# Patient Record
Sex: Female | Born: 1935 | Race: Black or African American | Hispanic: No | Marital: Single | Smoking: Never smoker
Health system: Southern US, Community
[De-identification: ages and names within clinical notes are randomized; demographics above are authoritative.]

## PROBLEM LIST (undated history)

## (undated) DIAGNOSIS — I10 Essential (primary) hypertension: Secondary | ICD-10-CM

## (undated) DIAGNOSIS — R799 Abnormal finding of blood chemistry, unspecified: Secondary | ICD-10-CM

## (undated) DIAGNOSIS — I350 Nonrheumatic aortic (valve) stenosis: Secondary | ICD-10-CM

## (undated) DIAGNOSIS — I5032 Chronic diastolic (congestive) heart failure: Secondary | ICD-10-CM

## (undated) DIAGNOSIS — E7800 Pure hypercholesterolemia, unspecified: Secondary | ICD-10-CM

## (undated) DIAGNOSIS — R748 Abnormal levels of other serum enzymes: Secondary | ICD-10-CM

## (undated) DIAGNOSIS — E119 Type 2 diabetes mellitus without complications: Secondary | ICD-10-CM

## (undated) HISTORY — PX: ABDOMINAL HYSTERECTOMY: SHX81

## (undated) HISTORY — DX: Chronic diastolic (congestive) heart failure: I50.32

## (undated) HISTORY — DX: Nonrheumatic aortic (valve) stenosis: I35.0

---

## 2012-12-03 ENCOUNTER — Emergency Department (HOSPITAL_BASED_OUTPATIENT_CLINIC_OR_DEPARTMENT_OTHER)
Admission: EM | Admit: 2012-12-03 | Discharge: 2012-12-03 | Disposition: A | Discharge status: Home / Self Care | Payer: Medicare Other | Attending: Emergency Medicine | Admitting: Emergency Medicine

## 2012-12-03 ENCOUNTER — Emergency Department (HOSPITAL_BASED_OUTPATIENT_CLINIC_OR_DEPARTMENT_OTHER): Payer: Medicare Other

## 2012-12-03 ENCOUNTER — Encounter (HOSPITAL_BASED_OUTPATIENT_CLINIC_OR_DEPARTMENT_OTHER): Payer: Self-pay | Admitting: Emergency Medicine

## 2012-12-03 DIAGNOSIS — I10 Essential (primary) hypertension: Secondary | ICD-10-CM | POA: Insufficient documentation

## 2012-12-03 DIAGNOSIS — E119 Type 2 diabetes mellitus without complications: Secondary | ICD-10-CM | POA: Insufficient documentation

## 2012-12-03 DIAGNOSIS — Z79899 Other long term (current) drug therapy: Secondary | ICD-10-CM | POA: Insufficient documentation

## 2012-12-03 DIAGNOSIS — S7000XA Contusion of unspecified hip, initial encounter: Secondary | ICD-10-CM | POA: Insufficient documentation

## 2012-12-03 DIAGNOSIS — S7001XA Contusion of right hip, initial encounter: Secondary | ICD-10-CM

## 2012-12-03 DIAGNOSIS — Y929 Unspecified place or not applicable: Secondary | ICD-10-CM | POA: Insufficient documentation

## 2012-12-03 DIAGNOSIS — Y939 Activity, unspecified: Secondary | ICD-10-CM | POA: Insufficient documentation

## 2012-12-03 DIAGNOSIS — Z880 Allergy status to penicillin: Secondary | ICD-10-CM | POA: Insufficient documentation

## 2012-12-03 DIAGNOSIS — S4980XA Other specified injuries of shoulder and upper arm, unspecified arm, initial encounter: Secondary | ICD-10-CM | POA: Insufficient documentation

## 2012-12-03 DIAGNOSIS — S46909A Unspecified injury of unspecified muscle, fascia and tendon at shoulder and upper arm level, unspecified arm, initial encounter: Secondary | ICD-10-CM | POA: Insufficient documentation

## 2012-12-03 DIAGNOSIS — W010XXA Fall on same level from slipping, tripping and stumbling without subsequent striking against object, initial encounter: Secondary | ICD-10-CM | POA: Insufficient documentation

## 2012-12-03 HISTORY — DX: Pure hypercholesterolemia, unspecified: E78.00

## 2012-12-03 HISTORY — DX: Type 2 diabetes mellitus without complications: E11.9

## 2012-12-03 HISTORY — DX: Essential (primary) hypertension: I10

## 2012-12-03 MED ORDER — HYDROCODONE-ACETAMINOPHEN 5-325 MG PO TABS: 2.0000 | ORAL_TABLET | ORAL | Status: DC | PRN

## 2012-12-03 NOTE — ED Provider Notes (Signed)
CSN: [id number]     Arrival date & time 12/03/12  1258 History   None    Chief Complaint  Patient presents with  . Fall   (Consider location/radiation/quality/duration/timing/severity/associated sxs/prior Treatment) Patient is a 79 y.o. female presenting with fall. The history is provided by the patient. No language interpreter was used.  Fall This is a new problem. The current episode started today. The problem occurs constantly. The problem has been unchanged. Nothing aggravates the symptoms. She has tried nothing for the symptoms. The treatment provided moderate relief.  Pt fell and hit on hip and shoulder  Past Medical History  Diagnosis Date  . Hypertension   . Diabetes mellitus without complication   . High cholesterol    History reviewed. No pertinent past surgical history. No family history on file. History  Substance Use Topics  . Smoking status: Never Smoker   . Smokeless tobacco: Not on file  . Alcohol Use: No   OB History   Grav Para Term Preterm Abortions TAB SAB Ect Mult Living                 Review of Systems  All other systems reviewed and are negative.    Allergies  Penicillins  Home Medications   Current Outpatient Rx  Name  Route  Sig  Dispense  Refill  . METFORMIN HCL PO   Oral   Take by mouth.         Marland Kitchen UNKNOWN TO PATIENT      BP med and cholesterol med-does not know name of meds          BP 154/58  Pulse 59  Temp(Src) 97.7 F (36.5 C) (Oral)  Resp 20  Ht 5\' 6"  (1.676 m)  Wt 168 lb (76.204 kg)  BMI 27.13 kg/m2  SpO2 99% Physical Exam  Nursing note and vitals reviewed. Constitutional: She is oriented to person, place, and time. She appears well-developed and well-nourished.  HENT:  Head: Normocephalic.  Eyes: EOM are normal. Pupils are equal, round, and reactive to light.  Neck: Normal range of motion.  Cardiovascular: Normal rate and normal heart sounds.   Pulmonary/Chest: Effort normal and breath sounds normal.   Abdominal: She exhibits no distension.  Musculoskeletal: She exhibits tenderness.  Tender right hip to palpation,  Tender right shoulder,   Pt able to ambulate without difficulty  Neurological: She is alert and oriented to person, place, and time.  Skin: Skin is warm.  Psychiatric: She has a normal mood and affect.    ED Course  Procedures (including critical care time) Labs Review Labs Reviewed - No data to display Imaging Review Dg Shoulder Right  12/03/2012   CLINICAL DATA:  Right shoulder pain after fall.  EXAM: RIGHT SHOULDER - 2+ VIEW  COMPARISON:  None.  FINDINGS: There is no evidence of fracture or dislocation. No significant degenerative joint disease is noted. Small calcification is seen over greater tuberosity concerning for calcific tendonitis. Soft tissues are unremarkable.  IMPRESSION: Possible calcific tendonitis seen. No fracture or dislocation is noted.   Electronically Signed   By: Sabino Dick M.D.   On: 12/03/2012 13:45   Dg Hip Complete Right  12/03/2012   CLINICAL DATA:  Right hip pain after fall.  EXAM: RIGHT HIP - COMPLETE 2+ VIEW  COMPARISON:  None.  FINDINGS: There is no evidence of hip fracture or dislocation. There is no evidence of arthropathy or other focal bone abnormality.  IMPRESSION: Negative.   Electronically Signed  By: Sabino Dick M.D.   On: 12/03/2012 13:46    EKG Interpretation   None       MDM   1. Contusion of right hip, initial encounter    Hydrocodone,   Pt advised to follow up with her Md for recheck    Fransico Meadow, PA-C 12/03/12 2147  [place], PA-C 12/03/12 2148

## 2012-12-03 NOTE — ED Notes (Addendum)
Tripped/fell Friday 10/17-c/o pain to right shoulder and right hip-states she has been walking

## 2012-12-04 NOTE — ED Provider Notes (Signed)
Medical screening examination/treatment/procedure(s) were performed by non-physician practitioner and as supervising physician I was immediately available for consultation/collaboration.  EKG Interpretation   None         Lebron Quam, MD 12/04/12 [phone]

## 2016-04-02 ENCOUNTER — Ambulatory Visit
Admission: RE | Admit: 2016-04-02 | Discharge: 2016-04-02 | Disposition: A | Discharge status: Home / Self Care | Payer: Medicare Other | Source: Ambulatory Visit | Attending: Internal Medicine | Admitting: Internal Medicine

## 2016-04-02 ENCOUNTER — Other Ambulatory Visit: Payer: Self-pay | Admitting: Internal Medicine

## 2016-04-02 DIAGNOSIS — R0602 Shortness of breath: Secondary | ICD-10-CM

## 2016-04-02 DIAGNOSIS — M542 Cervicalgia: Secondary | ICD-10-CM

## 2016-06-18 ENCOUNTER — Emergency Department (HOSPITAL_COMMUNITY): Payer: Medicare Other

## 2016-06-18 ENCOUNTER — Emergency Department (HOSPITAL_COMMUNITY)
Admission: EM | Admit: 2016-06-18 | Discharge: 2016-06-18 | Disposition: A | Discharge status: Home / Self Care | Payer: Medicare Other | Attending: Emergency Medicine | Admitting: Emergency Medicine

## 2016-06-18 DIAGNOSIS — R930 Abnormal findings on diagnostic imaging of skull and head, not elsewhere classified: Secondary | ICD-10-CM | POA: Insufficient documentation

## 2016-06-18 DIAGNOSIS — I10 Essential (primary) hypertension: Secondary | ICD-10-CM | POA: Diagnosis not present

## 2016-06-18 DIAGNOSIS — R42 Dizziness and giddiness: Secondary | ICD-10-CM | POA: Diagnosis not present

## 2016-06-18 DIAGNOSIS — R9389 Abnormal findings on diagnostic imaging of other specified body structures: Secondary | ICD-10-CM

## 2016-06-18 DIAGNOSIS — E119 Type 2 diabetes mellitus without complications: Secondary | ICD-10-CM | POA: Diagnosis not present

## 2016-06-18 DIAGNOSIS — Z79899 Other long term (current) drug therapy: Secondary | ICD-10-CM | POA: Diagnosis not present

## 2016-06-18 LAB — COMPREHENSIVE METABOLIC PANEL
ALK PHOS: 65 U/L (ref 38–126)
ALT: 9 U/L — ABNORMAL LOW (ref 14–54)
AST: 14 U/L — ABNORMAL LOW (ref 15–41)
Albumin: 3.7 g/dL (ref 3.5–5.0)
Anion gap: 9 (ref 5–15)
BILIRUBIN TOTAL: 0.6 mg/dL (ref 0.3–1.2)
BUN: 37 mg/dL — ABNORMAL HIGH (ref 6–20)
CO2: 23 mmol/L (ref 22–32)
CREATININE: 2.13 mg/dL — AB (ref 0.44–1.00)
Calcium: 8.9 mg/dL (ref 8.9–10.3)
Chloride: 106 mmol/L (ref 101–111)
GFR calc non Af Amer: 19 mL/min — ABNORMAL LOW (ref >60)
GFR, EST AFRICAN AMERICAN: 22 mL/min — AB (ref >60)
Glucose, Bld: 137 mg/dL — ABNORMAL HIGH (ref 65–99)
Potassium: 4.9 mmol/L (ref 3.5–5.1)
Sodium: 138 mmol/L (ref 135–145)
Total Protein: 6 g/dL — ABNORMAL LOW (ref 6.5–8.1)

## 2016-06-18 LAB — CBC
HEMATOCRIT: 32.8 % — AB (ref 36.0–46.0)
HEMOGLOBIN: 10.7 g/dL — AB (ref 12.0–15.0)
MCH: 29.6 pg (ref 26.0–34.0)
MCHC: 32.6 g/dL (ref 30.0–36.0)
MCV: 90.9 fL (ref 78.0–100.0)
Platelets: 143 10*3/uL — ABNORMAL LOW (ref 150–400)
RBC: 3.61 MIL/uL — ABNORMAL LOW (ref 3.87–5.11)
RDW: 14 % (ref 11.5–15.5)
WBC: 11 10*3/uL — ABNORMAL HIGH (ref 4.0–10.5)

## 2016-06-18 LAB — CBG MONITORING, ED: Glucose-Capillary: 137 mg/dL — ABNORMAL HIGH (ref 65–99)

## 2016-06-18 MED ORDER — MECLIZINE HCL 25 MG PO TABS: 12.5000 mg | ORAL_TABLET | Freq: Two times a day (BID) | ORAL | 0 refills | Status: DC | PRN

## 2016-06-18 MED ORDER — MECLIZINE HCL 25 MG PO TABS
25.0000 mg | ORAL_TABLET | Freq: Once | ORAL | Status: AC
  Administered 2016-06-18: 25 mg via ORAL
  Filled 2016-06-18: qty 1

## 2016-06-18 NOTE — ED Provider Notes (Signed)
[facility] DEPT Provider Note   CSN: [id number] Arrival date & time: 06/18/16  1433     History   Chief Complaint Chief Complaint  Patient presents with  . Dizziness    HPI Amber [name] is a 83 y.o. female.  The history is provided by the patient and a relative.  Dizziness  Quality:  Imbalance Severity:  Mild Onset quality:  Sudden Duration:  1 day Timing:  Intermittent Progression:  Unchanged Chronicity:  Recurrent Context comment:  Head movement Relieved by:  Closing eyes and being still Worsened by:  Eye movement and turning head Ineffective treatments:  None tried Associated symptoms: no chest pain, no diarrhea, no headaches, no shortness of breath, no syncope, no tinnitus, no vision changes and no vomiting     Past Medical History:  Diagnosis Date  . Diabetes mellitus without complication   . High cholesterol   . Hypertension     There are no active problems to display for this patient.   No past surgical history on file.  OB History    No data available       Home Medications    Prior to Admission medications   Medication Sig Start Date End Date Taking? Authorizing Provider  amLODipine-benazepril (LOTREL) 10-40 MG capsule Take 1 capsule by mouth daily.   Yes [provider]  furosemide (LASIX) 40 MG tablet Take 40 mg by mouth every Monday, Wednesday, and Friday.   Yes [provider]  rosuvastatin (CRESTOR) 20 MG tablet Take 20 mg by mouth daily.   Yes [provider]  HYDROcodone-acetaminophen (NORCO/VICODIN) 5-325 MG per tablet Take 2 tablets by mouth every 4 (four) hours as needed for pain. Patient not taking: Reported on 06/18/2016 12/03/12   Fransico Meadow, PA-C  meclizine (ANTIVERT) 25 MG tablet Take 0.5 tablets (12.5 mg total) by mouth 2 (two) times daily as needed for dizziness. 06/18/16   Heriberto Antigua, MD    Family History No family history on file.  Social History Social History  Substance Use Topics   . Smoking status: Never Smoker  . Smokeless tobacco: Not on file  . Alcohol use No     Allergies   Penicillins   Review of Systems Review of Systems  Constitutional: Negative for fever.  HENT: Negative for tinnitus.   Eyes: Negative.   Respiratory: Negative for shortness of breath.   Cardiovascular: Negative for chest pain and syncope.  Gastrointestinal: Negative for abdominal pain, constipation, diarrhea and vomiting.  Genitourinary: Negative.   Neurological: Positive for dizziness. Negative for syncope and headaches.  All other systems reviewed and are negative.    Physical Exam Updated Vital Signs BP (!) 174/55 (BP Location: Right Arm)   Pulse (!) 55   Temp 98.3 F (36.8 C) (Oral)   Resp 18   SpO2 94%   Physical Exam  Constitutional: She is oriented to person, place, and time. She appears well-developed and well-nourished. No distress.  HENT:  Head: Normocephalic and atraumatic.  Mouth/Throat: Oropharynx is clear and moist.  Eyes: Conjunctivae and EOM are normal. Pupils are equal, round, and reactive to light.  No vertical or rotational nystagmus  Neck: Normal range of motion. Neck supple.  Cardiovascular: Normal rate, regular rhythm and intact distal pulses.   No murmur heard. Pulmonary/Chest: Effort normal and breath sounds normal. No respiratory distress.  Abdominal: Soft. There is no tenderness.  Musculoskeletal: She exhibits no edema or tenderness.  Neurological: She is alert and oriented to person, place, and time.  No cranial nerve deficit or sensory deficit. She exhibits normal muscle tone. Coordination normal.  Skin: Skin is warm and dry.  Psychiatric: She has a normal mood and affect.  Nursing note and vitals reviewed.    ED Treatments / Results  Labs (all labs ordered are listed, but only abnormal results are displayed) Labs Reviewed  CBC - Abnormal; Notable for the following:       Result Value   WBC 11.0 (*)    RBC 3.61 (*)    Hemoglobin  10.7 (*)    HCT 32.8 (*)    Platelets 143 (*)    All other components within normal limits  COMPREHENSIVE METABOLIC PANEL - Abnormal; Notable for the following:    Glucose, Bld 137 (*)    BUN 37 (*)    Creatinine, Ser 2.13 (*)    Total Protein 6.0 (*)    AST 14 (*)    ALT 9 (*)    GFR calc non Af Amer 19 (*)    GFR calc Af Amer 22 (*)    All other components within normal limits  CBG MONITORING, ED - Abnormal; Notable for the following:    Glucose-Capillary 137 (*)    All other components within normal limits    EKG  EKG Interpretation  Date/Time:  Monday Jun 18 2016 14:49:54 EDT Ventricular Rate:  53 PR Interval:    QRS Duration: 132 QT Interval:  482 QTC Calculation: 453 R Axis:   -46 Text Interpretation:  Sinus rhythm Nonspecific IVCD with LAD Left ventricular hypertrophy Confirmed by Stark Jock  MD, DOUGLAS ([id number]) on 06/18/2016 3:18:48 PM       Radiology Mr Brain Wo Contrast  Result Date: 06/18/2016 CLINICAL DATA:  Dizziness and general weakness. Similar episodes in the past. EXAM: MRI HEAD WITHOUT CONTRAST TECHNIQUE: Multiplanar, multiecho pulse sequences of the brain and surrounding structures were obtained without intravenous contrast. COMPARISON:  Cervical spine plain films 04/02/2016 FINDINGS: Brain: No evidence for acute infarction, hemorrhage, mass lesion, hydrocephalus, or extra-axial fluid. Advanced atrophy. Chronic microvascular ischemic change. Remote RIGHT basal ganglia infarct. Vascular: Normal flow voids. Skull and upper cervical spine: Normal skullbase marrow signal. There is marked pannus surrounding the odontoid. There could be osseous erosion, possible bone marrow edema, at the base of the odontoid, body of C2, not well visualized on other pulse sequences. Sinuses/Orbits: Negative. Other: None. IMPRESSION: Advanced atrophy and chronic microvascular ischemic change. No acute intracranial abnormality. Marked pannus surrounds the odontoid, with possible erosion of the  body of C2, incompletely evaluated. Recommend MRI of the cervical spine without with contrast, with special attention to the upper cervical region. Electronically Signed   By: Staci Righter M.D.   On: 06/18/2016 20:46    Procedures Procedures (including critical care time)  Medications Ordered in ED Medications  meclizine (ANTIVERT) tablet 25 mg (25 mg Oral Given 06/18/16 1602)     Initial Impression / Assessment and Plan / ED Course  I have reviewed the triage vital signs and the nursing notes.  Pertinent labs & imaging results that were available during my care of the patient were reviewed by me and considered in my medical decision making (see chart for details).     Patient is a 83 year old female with above past medical history presents with 1 day of dizziness that she describes as spinning and off balance. No syncope, chest pain, shortness of breath, headache. No inciting events. She states this is worsened with any sort of head movement and better  when she is lying still. Vital signs unremarkable and exam as above without neuro deficits but symptoms are exacerbated with standing and head movement. Orthostatic blood pressures without change. Labs obtained without anemia or electrolyte disturbances. Given her age MRI obtained without evidence of posterior circulation findings. Patient given meclizine with complete resolution of symptoms. At this time I doubt ACS, CVA, or other acute surgical or infective etiology.  I have reviewed all labs, ekg, and imaging. Patient stable for discharge home.  I have reviewed all results with the patient. Advised to f/u with pcp within 3-5 days. Will rx meclizine. Patient agrees to stated plan. All questions answered. Advised to call or return to have any questions, new symptoms, change in symptoms, or symptoms that they do not understand.   Final Clinical Impressions(s) / ED Diagnoses   Final diagnoses:  Dizziness  Abnormal MRI    New  Prescriptions Discharge Medication List as of 06/18/2016  9:04 PM    START taking these medications   Details  meclizine (ANTIVERT) 25 MG tablet Take 0.5 tablets (12.5 mg total) by mouth 2 (two) times daily as needed for dizziness., Starting Mon 06/18/2016, Print         Heriberto Antigua, MD 06/19/16 Donzetta Kohut    Veryl Speak, MD 06/19/16 2201

## 2016-06-18 NOTE — Discharge Instructions (Signed)
call or return to have any questions, new symptoms, change in symptoms, or symptoms that you  do not understand.

## 2016-06-18 NOTE — ED Triage Notes (Signed)
CO of dizziness and general weakness with similar episodes in the past. Family states that family needs help to walk which is abnormal for her. No LOC or actual falls. Orthostatic changes with EMS.

## 2016-06-20 ENCOUNTER — Emergency Department (HOSPITAL_COMMUNITY)
Admission: EM | Admit: 2016-06-20 | Discharge: 2016-06-20 | Disposition: A | Discharge status: Home / Self Care | Payer: Medicare Other | Attending: Emergency Medicine | Admitting: Emergency Medicine

## 2016-06-20 ENCOUNTER — Encounter (HOSPITAL_COMMUNITY): Payer: Self-pay | Admitting: *Deleted

## 2016-06-20 DIAGNOSIS — Z79899 Other long term (current) drug therapy: Secondary | ICD-10-CM | POA: Diagnosis not present

## 2016-06-20 DIAGNOSIS — R42 Dizziness and giddiness: Secondary | ICD-10-CM | POA: Insufficient documentation

## 2016-06-20 DIAGNOSIS — I10 Essential (primary) hypertension: Secondary | ICD-10-CM | POA: Diagnosis not present

## 2016-06-20 DIAGNOSIS — E119 Type 2 diabetes mellitus without complications: Secondary | ICD-10-CM | POA: Insufficient documentation

## 2016-06-20 LAB — BASIC METABOLIC PANEL
Anion gap: 9 (ref 5–15)
BUN: 36 mg/dL — ABNORMAL HIGH (ref 6–20)
CALCIUM: 8.7 mg/dL — AB (ref 8.9–10.3)
CHLORIDE: 105 mmol/L (ref 101–111)
CO2: 23 mmol/L (ref 22–32)
CREATININE: 2.16 mg/dL — AB (ref 0.44–1.00)
GFR calc Af Amer: 21 mL/min — ABNORMAL LOW (ref >60)
GFR calc non Af Amer: 19 mL/min — ABNORMAL LOW (ref >60)
GLUCOSE: 135 mg/dL — AB (ref 65–99)
Potassium: 4.5 mmol/L (ref 3.5–5.1)
Sodium: 137 mmol/L (ref 135–145)

## 2016-06-20 LAB — CBC
HCT: 33.1 % — ABNORMAL LOW (ref 36.0–46.0)
Hemoglobin: 10.7 g/dL — ABNORMAL LOW (ref 12.0–15.0)
MCH: 29.1 pg (ref 26.0–34.0)
MCHC: 32.3 g/dL (ref 30.0–36.0)
MCV: 89.9 fL (ref 78.0–100.0)
PLATELETS: 165 10*3/uL (ref 150–400)
RBC: 3.68 MIL/uL — AB (ref 3.87–5.11)
RDW: 13.6 % (ref 11.5–15.5)
WBC: 7.5 10*3/uL (ref 4.0–10.5)

## 2016-06-20 MED ORDER — MECLIZINE HCL 25 MG PO TABS
12.5000 mg | ORAL_TABLET | Freq: Once | ORAL | Status: DC
  Filled 2016-06-20: qty 1

## 2016-06-20 MED ORDER — MECLIZINE HCL 25 MG PO TABS: 12.5000 mg | ORAL_TABLET | Freq: Three times a day (TID) | ORAL | 0 refills | Status: AC | PRN

## 2016-06-20 NOTE — ED Triage Notes (Signed)
Pt was seen here on Sunday, diagnosed with vertigo and given meclizine prescription. Pt has taken two half tablets today for dizziness, which she feels has helped some. Pt reports her family brought her back in to make sure she was ok

## 2016-06-20 NOTE — ED Notes (Signed)
Pt ambulated self efficiently to restroom with no difficulty.

## 2016-06-21 NOTE — ED Provider Notes (Signed)
[facility] DEPT Provider Note   CSN: [id number] Arrival date & time: 06/20/16  1854     History   Chief Complaint Chief Complaint  Patient presents with  . Dizziness    HPI Amber [name] is a 83 y.o. female.   Dizziness  Quality:  Vertigo Severity:  Moderate Onset quality:  Sudden Duration:  2 hours Timing:  Constant Progression:  Resolved Chronicity:  Recurrent Context: head movement   Relieved by: meclizine. Worsened by:  Movement Risk factors: hx of vertigo     Past Medical History:  Diagnosis Date  . Diabetes mellitus without complication ([place])   . High cholesterol   . Hypertension     There are no active problems to display for this patient.   History reviewed. No pertinent surgical history.  OB History    No data available       Home Medications    Prior to Admission medications   Medication Sig Start Date End Date Taking? Authorizing Provider  amLODipine-benazepril (LOTREL) 10-40 MG capsule Take 1 capsule by mouth daily.    [provider]  furosemide (LASIX) 40 MG tablet Take 40 mg by mouth every Monday, Wednesday, and Friday.    [provider]  HYDROcodone-acetaminophen (NORCO/VICODIN) 5-325 MG per tablet Take 2 tablets by mouth every 4 (four) hours as needed for pain. Patient not taking: Reported on 06/18/2016 12/03/12   Fransico Meadow, PA-C  meclizine (ANTIVERT) 25 MG tablet Take 0.5-1 tablets (12.5-25 mg total) by mouth 3 (three) times daily as needed for dizziness. 06/20/16   Jondavid Schreier, Corene Cornea, MD  rosuvastatin (CRESTOR) 20 MG tablet Take 20 mg by mouth daily.    [provider]    Family History No family history on file.  Social History Social History  Substance Use Topics  . Smoking status: Never Smoker  . Smokeless tobacco: Never Used  . Alcohol use No     Allergies   Penicillins   Review of Systems Review of Systems  Neurological: Positive for dizziness.  All other systems reviewed and are  negative.    Physical Exam Updated Vital Signs BP (!) 176/50   Pulse (!) 51   Temp 98.2 F (36.8 C)   Resp 15   SpO2 95%   Physical Exam  Constitutional: She is oriented to person, place, and time. She appears well-developed and well-nourished.  HENT:  Head: Normocephalic and atraumatic.  Eyes: Conjunctivae and EOM are normal.  No nystagmus  Neck: Normal range of motion.  Cardiovascular: Normal rate and regular rhythm.   Pulmonary/Chest: No stridor. No respiratory distress.  Abdominal: She exhibits no distension.  Neurological: She is alert and oriented to person, place, and time. No cranial nerve deficit. Coordination normal.  No altered mental status, able to give full seemingly accurate history.  Face is symmetric, EOM's intact, pupils equal and reactive, vision intact, tongue and uvula midline without deviation Upper and Lower extremity motor 5/5, intact pain perception in distal extremities, 2+ reflexes in biceps, patella and achilles tendons. Finger to nose normal, heel to shin normal. Walks without assistance or evident ataxia.    Nursing note and vitals reviewed.    ED Treatments / Results  Labs (all labs ordered are listed, but only abnormal results are displayed) Labs Reviewed  BASIC METABOLIC PANEL - Abnormal; Notable for the following:       Result Value   Glucose, Bld 135 (*)    BUN 36 (*)    Creatinine, Ser 2.16 (*)  Calcium 8.7 (*)    GFR calc non Af Amer 19 (*)    GFR calc Af Amer 21 (*)    All other components within normal limits  CBC - Abnormal; Notable for the following:    RBC 3.68 (*)    Hemoglobin 10.7 (*)    HCT 33.1 (*)    All other components within normal limits    EKG  EKG Interpretation  Date/Time:  Wednesday Jun 20 2016 19:13:39 EDT Ventricular Rate:  49 PR Interval:  196 QRS Duration: 130 QT Interval:  468 QTC Calculation: 422 R Axis:   -47 Text Interpretation:  Sinus bradycardia Left axis deviation Left bundle branch  block Abnormal ECG rhtyhm and rate same as may 7 Confirmed by [facility] MD, Corene Cornea [phone]) on 06/20/2016 9:49:51 PM       Radiology No results found.  Procedures Procedures (including critical care time)  Medications Ordered in ED Medications - No data to display   Initial Impression / Assessment and Plan / ED Course  I have reviewed the triage vital signs and the nursing notes.  Pertinent labs & imaging results that were available during my care of the patient were reviewed by me and considered in my medical decision making (see chart for details).     Recurrent episodes of vertigo today. Patient symptoms improved with medications but family did not understand medication/follow up instructions from previous visit where full workup was done.  HR was documented as 39 from triage however I did not see it lower than 49 and she was asymptomatic so I doubt it is related.  Will fu w/ pcp on Friday as scheduled, otherwise will follow up with ENT in a week if not improving.   Final Clinical Impressions(s) / ED Diagnoses   Final diagnoses:  Vertigo    New Prescriptions Discharge Medication List as of 06/20/2016 10:58 PM       Mariano Doshi, Corene Cornea, MD 06/21/16 1200

## 2016-07-03 ENCOUNTER — Other Ambulatory Visit: Payer: Self-pay | Admitting: Internal Medicine

## 2016-07-03 DIAGNOSIS — M542 Cervicalgia: Secondary | ICD-10-CM

## 2016-07-05 ENCOUNTER — Ambulatory Visit
Admission: RE | Admit: 2016-07-05 | Discharge: 2016-07-05 | Disposition: A | Discharge status: Home / Self Care | Payer: Medicare Other | Source: Ambulatory Visit | Attending: Internal Medicine | Admitting: Internal Medicine

## 2016-07-05 DIAGNOSIS — M542 Cervicalgia: Secondary | ICD-10-CM

## 2016-07-10 ENCOUNTER — Other Ambulatory Visit: Payer: Self-pay | Admitting: Cardiology

## 2016-07-10 ENCOUNTER — Ambulatory Visit
Admission: RE | Admit: 2016-07-10 | Discharge: 2016-07-10 | Disposition: A | Discharge status: Home / Self Care | Payer: Medicare Other | Source: Ambulatory Visit | Attending: Cardiology | Admitting: Cardiology

## 2016-07-10 DIAGNOSIS — I509 Heart failure, unspecified: Secondary | ICD-10-CM

## 2016-07-30 ENCOUNTER — Institutional Professional Consult (permissible substitution): Payer: Medicare Other | Admitting: Cardiovascular Disease

## 2016-07-30 ENCOUNTER — Telehealth: Payer: Self-pay | Admitting: Cardiovascular Disease

## 2016-07-30 NOTE — Telephone Encounter (Signed)
I spoke with Amber [name] and advised that the next available TAVR slot is not until 08/22/16 with Dr Angelena Form.  Amber [name] booked an appt this day and requested that we contact him if there is a cancellation.

## 2016-07-30 NOTE — Telephone Encounter (Signed)
Pt's grandson calling to cxl TAVR consult and RS, can she get RS ? Pls call grandson Jeneen Rinks

## 2016-08-12 DIAGNOSIS — R799 Abnormal finding of blood chemistry, unspecified: Secondary | ICD-10-CM

## 2016-08-12 HISTORY — DX: Abnormal finding of blood chemistry, unspecified: R79.9

## 2016-08-22 ENCOUNTER — Encounter: Payer: Self-pay | Admitting: *Deleted

## 2016-08-22 ENCOUNTER — Other Ambulatory Visit: Payer: Self-pay | Admitting: *Deleted

## 2016-08-22 ENCOUNTER — Encounter: Payer: Self-pay | Admitting: Cardiovascular Disease

## 2016-08-22 ENCOUNTER — Ambulatory Visit (INDEPENDENT_AMBULATORY_CARE_PROVIDER_SITE_OTHER): Payer: Medicare Other | Admitting: Cardiovascular Disease

## 2016-08-22 ENCOUNTER — Encounter (INDEPENDENT_AMBULATORY_CARE_PROVIDER_SITE_OTHER): Payer: Self-pay

## 2016-08-22 VITALS — BP 148/60 | HR 55 | Ht 66.0 in | Wt 174.0 lb

## 2016-08-22 DIAGNOSIS — I350 Nonrheumatic aortic (valve) stenosis: Secondary | ICD-10-CM

## 2016-08-22 NOTE — Progress Notes (Signed)
Chief Complaint  Patient presents with  . New Evaluation    severe aortic valve stenosis    History of Present Illness: 83 yo female with history of diabetes mellitus, hypertension, hyperlipidemia, chronic kidney disease and severe aortic stenosis with moderate aortic regurgitation who is here today as a new consult for evaluation of her aortic valve disease. She is followed in [facility] by Dr. Woody Seller who has referred her to our office today to discuss TAVR. She has had progressive dyspnea with exertion for the last several months. She has been on Lasix for lower extremity edema. She also has dizziness with near syncope. Echocardiogram in the [facility] office march 13,2018 showed mildly dilated LV with concentric hypertrophy of the LV. There was grade 2 diastolic dysfunction. LVEF >65%. There was severe aortic valve stenosis with mean gradient of 47 mmHg, peak gradient of 76 mmHg, AVA 0.74 cm2. There is mild mitral regurgitation and moderate tricuspid regurgitation.   She is here today with her family. 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 She lives in her private house and her grandson lives with her. She can walk about fifty feet before having dyspnea. She has had LE edema. She has had dizziness dating back to February 2018. Near syncope in February. No true syncopal events over last 6 months.    Primary Care Physician: Jilda Panda, MD Primary Cardiologist: Dr. Vear Clock [facility]) Referring: Woody Seller  Past Medical History:  Diagnosis Date  . Aortic stenosis   . Chronic diastolic CHF (congestive heart failure) ([place])   . Diabetes mellitus without complication ([place])   . High cholesterol   . Hypertension     Past Surgical History:  Procedure Laterality Date  . ABDOMINAL HYSTERECTOMY      Current Outpatient Prescriptions  Medication Sig Dispense Refill  . amLODipine-benazepril (LOTREL) 10-40 MG capsule Take 1 capsule by  mouth daily.    Marland Kitchen BIOTIN 5000 PO Take 5,000 mcg by mouth daily.    . cholecalciferol (VITAMIN D) 400 units TABS tablet Take 400 Units by mouth daily.    . furosemide (LASIX) 40 MG tablet Take 40 mg by mouth every Monday, Wednesday, and Friday.    . nebivolol (BYSTOLIC) 5 MG tablet Take 5 mg by mouth daily. 1/2 a tablet    . rosuvastatin (CRESTOR) 20 MG tablet Take 20 mg by mouth daily.    . vitamin C (ASCORBIC ACID) 500 MG tablet Take 500 mg by mouth daily.    . meclizine (ANTIVERT) 25 MG tablet Take 0.5-1 tablets (12.5-25 mg total) by mouth 3 (three) times daily as needed for dizziness. (Patient not taking: Reported on 08/22/2016) 15 tablet 0   No current facility-administered medications for this visit.     Allergies  Allergen Reactions  . Penicillins Rash    Social History   Social History  . Marital status: Single    Spouse name: N/A  . Number of children: 2  . Years of education: N/A   Occupational History  . Hosiery mill, school cafeteria-retired    Social History Main Topics  . Smoking status: Never Smoker  . Smokeless tobacco: Never Used  . Alcohol use No  . Drug use: No  . Sexual activity: Not on file   Other Topics Concern  . Not on file   Social History Narrative  . No narrative on file    Family History  Problem Relation Age of Onset  . Diabetes Mother   .  Cancer Father     Review of Systems:  As stated in the HPI and otherwise negative.   BP (!) 148/60   Pulse (!) 55   Ht 5\' 6"  (1.676 m)   Wt 174 lb (78.9 kg)   SpO2 92%   BMI 28.08 kg/m   Physical Examination: General: Elderly female in NAD.   HEENT: OP clear, mucus membranes moist  SKIN: warm, dry. No rashes. Neuro: No focal deficits  Musculoskeletal: Muscle strength 5/5 all ext  Psychiatric: Mood and affect normal  Neck: No JVD, no carotid bruits, no thyromegaly, no lymphadenopathy.  Lungs:Clear bilaterally, no wheezes, rhonci, crackles Cardiovascular: Regular rate and rhythm. Loud  harsh systolic murmur.  Abdomen:Soft. Bowel sounds present. Non-tender.  Extremities: Trace bilateral lower extremity edema. Pulses are 2 + in the bilateral DP/PT.  EKG:  EKG is ordered today. The ekg ordered today demonstrates Sinus brady, rate 54 bpm. LAD. IVCD  Recent Labs: 06/18/2016: ALT 9 06/20/2016: BUN 36; Creatinine, Ser 2.16; Hemoglobin 10.7; Platelets 165; Potassium 4.5; Sodium 137   Lipid Panel No results found for: CHOL, TRIG, HDL, CHOLHDL, VLDL, LDLCALC, LDLDIRECT   Wt Readings from Last 3 Encounters:  08/22/16 174 lb (78.9 kg)  12/03/12 168 lb (76.2 kg)     Other studies Reviewed: Additional studies/ records that were reviewed today include: . Review of the above records demonstrates:   STS Risk Score:  Risk of Mortality: 11.007%  Morbidity or Mortality: 41.822%  Long Length of Stay: 27.301%  Short Length of Stay: 5.687%  Permanent Stroke: 4.964%  Prolonged Ventilation: 30.779%  DSW Infection: 0.231%  Renal Failure: 24.74%  Reoperation: 12.085%    Assessment and Plan:   1. Severe aortic valve stenosis: She has stage D symptomatic aortic valve stenosis. I cannot review her echo images from [facility]. The report indicates that the aortic valve is thickened, calcified with limited leaflet mobility. I think she would benefit from AVR. Given advanced age, she is not a good candidate for conventional AVR by surgical approach. I think she may be a good candidate for TAVR. She is a very functional 83 yo patient. I have reviewed the TAVR procedure in detail today with the patient and her family. She would like to proceed with planning for TAVR. I will arrange an echo to assess her current LV function and aortic valve gradient. I will arrange a right and left heart catheterization at [facility] on 09/03/16 at 10:30am Risks and benefits of procedure reviewed with the patient. After the cath, she will be referred to see one of the CT surgeons on our TAVR team. She will then  have Cardiac CT and CTA of the chest/abd/pelvis which will need to be staged due to her chronic kidney disease. She will also need PFTs and carotid dopplers.    Current medicines are reviewed at length with the patient today.  The patient does not have concerns regarding medicines.  The following changes have been made:  no change  Labs/ tests ordered today include:   Orders Placed This Encounter  Procedures  . Basic Metabolic Panel (BMET)  . CBC w/Diff  . INR/PT  . EKG 12-Lead  . ECHOCARDIOGRAM COMPLETE     Disposition:   Cardiac cath then f/u with TAVR team.    Signed, [name], MD 08/22/2016 3:45 PM    [address], [place], [place]  [address] Phone: [phone]; Fax: [phone]

## 2016-08-22 NOTE — Patient Instructions (Signed)
Medication Instructions:  Your physician recommends that you continue on your current medications as directed. Please refer to the Current Medication list given to you today.   Labwork: Lab work to be done day of echo--BMP, CBC, PT  Testing/Procedures: Your physician has requested that you have an echocardiogram. Echocardiography is a painless test that uses sound waves to create images of your heart. It provides your doctor with information about the size and shape of your heart and how well your heart's chambers and valves are working. This procedure takes approximately one hour. There are no restrictions for this procedure.  Please schedule for July 19 or 20  Your physician has requested that you have a cardiac catheterization. Cardiac catheterization is used to diagnose and/or treat various heart conditions. Doctors may recommend this procedure for a number of different reasons. The most common reason is to evaluate chest pain. Chest pain can be a symptom of coronary artery disease (CAD), and cardiac catheterization can show whether plaque is narrowing or blocking your heart's arteries. This procedure is also used to evaluate the valves, as well as measure the blood flow and oxygen levels in different parts of your heart. For further information please visit [email or web address]. Please follow instruction sheet, as given.  Scheduled for July 23,2018  Follow-Up: To be arranged after catheterization.   Any Other Special Instructions Will Be Listed Below (If Applicable).     If you need a refill on your cardiac medications before your next appointment, please call your pharmacy.

## 2016-08-30 ENCOUNTER — Ambulatory Visit (HOSPITAL_COMMUNITY): Payer: Medicare Other

## 2016-08-31 ENCOUNTER — Other Ambulatory Visit: Payer: Medicare Other | Admitting: *Deleted

## 2016-08-31 ENCOUNTER — Telehealth: Payer: Self-pay

## 2016-08-31 ENCOUNTER — Ambulatory Visit (HOSPITAL_COMMUNITY): Payer: Medicare Other | Attending: Cardiovascular Disease

## 2016-08-31 ENCOUNTER — Other Ambulatory Visit: Payer: Self-pay

## 2016-08-31 ENCOUNTER — Ambulatory Visit: Payer: Medicare Other | Attending: Cardiovascular Disease | Admitting: Physical Therapy

## 2016-08-31 ENCOUNTER — Other Ambulatory Visit: Payer: Self-pay | Admitting: Internal Medicine

## 2016-08-31 ENCOUNTER — Encounter: Payer: Self-pay | Admitting: Physical Therapy

## 2016-08-31 DIAGNOSIS — E7800 Pure hypercholesterolemia, unspecified: Secondary | ICD-10-CM | POA: Insufficient documentation

## 2016-08-31 DIAGNOSIS — I350 Nonrheumatic aortic (valve) stenosis: Secondary | ICD-10-CM

## 2016-08-31 DIAGNOSIS — I082 Rheumatic disorders of both aortic and tricuspid valves: Secondary | ICD-10-CM | POA: Diagnosis not present

## 2016-08-31 DIAGNOSIS — I5032 Chronic diastolic (congestive) heart failure: Secondary | ICD-10-CM | POA: Insufficient documentation

## 2016-08-31 DIAGNOSIS — I110 Hypertensive heart disease with heart failure: Secondary | ICD-10-CM | POA: Insufficient documentation

## 2016-08-31 DIAGNOSIS — R293 Abnormal posture: Secondary | ICD-10-CM | POA: Diagnosis present

## 2016-08-31 DIAGNOSIS — E119 Type 2 diabetes mellitus without complications: Secondary | ICD-10-CM | POA: Insufficient documentation

## 2016-08-31 DIAGNOSIS — R2689 Other abnormalities of gait and mobility: Secondary | ICD-10-CM | POA: Diagnosis not present

## 2016-08-31 LAB — CBC WITH DIFFERENTIAL/PLATELET
BASOS ABS: 0 10*3/uL (ref 0.0–0.2)
Basos: 0 %
EOS (ABSOLUTE): 0.2 10*3/uL (ref 0.0–0.4)
Eos: 2 %
HEMATOCRIT: 31.9 % — AB (ref 34.0–46.6)
HEMOGLOBIN: 10.8 g/dL — AB (ref 11.1–15.9)
LYMPHS: 11 %
Lymphocytes Absolute: 0.8 10*3/uL (ref 0.7–3.1)
MCH: 28.2 pg (ref 26.6–33.0)
MCHC: 33.9 g/dL (ref 31.5–35.7)
MCV: 83 fL (ref 79–97)
Monocytes Absolute: 0.5 10*3/uL (ref 0.1–0.9)
Monocytes: 7 %
NEUTROS ABS: 5.5 10*3/uL (ref 1.4–7.0)
NEUTROS PCT: 80 %
Platelets: 181 10*3/uL (ref 150–379)
RBC: 3.83 x10E6/uL (ref 3.77–5.28)
RDW: 14.5 % (ref 12.3–15.4)
WBC: 7 10*3/uL (ref 3.4–10.8)

## 2016-08-31 LAB — BASIC METABOLIC PANEL
BUN/Creatinine Ratio: 13 (ref 12–28)
BUN: 28 mg/dL (ref 10–36)
CHLORIDE: 104 mmol/L (ref 96–106)
CO2: 27 mmol/L (ref 20–29)
Calcium: 9.4 mg/dL (ref 8.7–10.3)
Creatinine, Ser: 2.11 mg/dL — ABNORMAL HIGH (ref 0.57–1.00)
GFR calc Af Amer: 23 mL/min/{1.73_m2} — ABNORMAL LOW (ref >59)
GFR calc non Af Amer: 20 mL/min/{1.73_m2} — ABNORMAL LOW (ref >59)
Glucose: 140 mg/dL — ABNORMAL HIGH (ref 65–99)
POTASSIUM: 4.7 mmol/L (ref 3.5–5.2)
Sodium: 142 mmol/L (ref 134–144)

## 2016-08-31 LAB — PROTIME-INR
INR: 1 (ref 0.8–1.2)
PROTHROMBIN TIME: 10.6 s (ref 9.1–12.0)

## 2016-08-31 NOTE — Telephone Encounter (Signed)
Patient contacted pre-catheterization at [facility] scheduled for:  09/03/2016 @ 1030  Pt scheduled for ECHO and lab work to be done today 08/31/2016 in office @ 1300. Spoke with Pt about testing needed today.  Gave direction to [employer]. Will speak with Pt after ECHO and labs.

## 2016-08-31 NOTE — Therapy (Signed)
[place], [place], [address] Phone: [phone]   Fax:  [phone]  Physical Therapy Evaluation  Patient Details  Name: Amber [name] MRN: [id number] Date of Birth: [birth year] Referring Provider: Dr. [name]  Encounter Date: 08/31/2016      PT End of Session - 08/31/16 1223    Visit Number 1   PT Start Time 1115   PT Stop Time 1152   PT Time Calculation (min) 37 min   Equipment Utilized During Treatment Gait belt      Past Medical History:  Diagnosis Date  . Aortic stenosis   . Chronic diastolic CHF (congestive heart failure) ([place])   . Diabetes mellitus without complication ([place])   . High cholesterol   . Hypertension     Past Surgical History:  Procedure Laterality Date  . ABDOMINAL HYSTERECTOMY      There were no vitals filed for this visit.       Subjective Assessment - 08/31/16 1122    Subjective Pt reports shortness of breath with walking short distances although reports dizziness seems to have improved.    Patient Stated Goals to fix heart   Currently in Pain? No/denies            [facility] PT Assessment - 08/31/16 0001      Assessment   Medical Diagnosis severe aortic stenosis   Referring Provider Dr. [name]   Onset Date/Surgical Date --  several months     Precautions   Precautions None     Restrictions   Weight Bearing Restrictions No     Balance Screen   Has the patient fallen in the past 6 months No   Has the patient had a decrease in activity level because of a fear of falling?  No   Is the patient reluctant to leave their home because of a fear of falling?  No     Home Environment   Living Environment Private residence   Living Arrangements Other relatives  grandson   Home Access Stairs to enter   Entrance Stairs-Number of Steps 3   Entrance Stairs-Rails Right;Left     Prior Function   Level of Independence Independent with  household mobility without device;Independent with community mobility without device     Posture/Postural Control   Posture/Postural Control Postural limitations   Postural Limitations Forward head;Rounded Shoulders     ROM / Strength   AROM / PROM / Strength AROM;Strength     AROM   Overall AROM Comments grossly WNL     Strength   Overall Strength Comments grossly 4/5   Strength Assessment Site Hand   Right/Left hand Right;Left   Right Hand Grip (lbs) 50  R hand dominant   Left Hand Grip (lbs) 43     Ambulation/Gait   Gait Comments Pt ambulates with decr. step length bil and with a cautious gait.           [facility] Pre-Surgical Assessment - 08/31/16 0001    5 Meter Walk Test- trial 1 6 sec   4 Stage Balance Test tolerated for:  3 sec.   4 Stage Balance Test Position 3   comment O2 dropped to 79%, 2.5 minutes to recover to above 90%   Comment unable without UE support   ADL/IADL Independent with: Bathing;Dressing;Meal prep   ADL/IADL Needs Assistance with: Valla Leaver work   ADL/IADL Fraility Index Vulnerable   6 Minute Walk- Baseline yes   BP (mmHg) 182/73  HR (bpm) 52   02 Sat (%RA) 92 %   Modified Borg Scale for Dyspnea 0- Nothing at all   Perceived Rate of Exertion (Borg) 6-   6 Minute Walk Post Test yes   BP (mmHg) 182/68   HR (bpm) 57   02 Sat (%RA) 85 %   Modified Borg Scale for Dyspnea 2- Mild shortness of breath   Endurance additional comments 6 minute walk test not completed due to O2 dropping to 79% following static balance testings. It recovered after 2.5 minutes. It quickly dropped again and remained at 85% with any walking. Evaluation stopped. Pt was headed with family to further testing at the [facility] as previously scheduled. Notified TAVR RN.           Objective measurements completed on examination: See above findings.                  PT Education - 09/26/2016 1223    Education provided Yes   Education Details fall risk and use of  assistive device   Person(s) Educated Patient;Child(ren)   Methods Explanation   Comprehension Verbalized understanding                     Plan - Sep 26, 2016 1223    Clinical Impression Statement see below   PT Frequency One time visit     Clinical Impression Statement: Pt is a 83 yo female presenting to OP PT for evaluation prior to possible TAVR surgery due to severe aortic stenosis. Pt reports onset of shortness of breath with minimal walking several months ago. Pt presents with good ROM and strength, poor balance and is at high fall risk 4 stage balance test, good walking speed and poor aerobic endurance. Unable to complete 6 minute walk due to oxygen desaturation to 85%  with walking and as low as 79% immediately following balance testing. Based on the Short Physical Performance Battery, patient has a frailty rating of 5/12 with </= 5/12 considered frail.   Patient demonstrated he following deficits and impairments:     Visit Diagnosis: Other abnormalities of gait and mobility  Abnormal posture      G-Codes - 09/26/2016 1224    Functional Assessment Tool Used (Outpatient Only) oxygen desaturation to 85% with ambulation    Functional Limitation Mobility: Walking and moving around   Mobility: Walking and Moving Around Current Status [phone]) At least 80 percent but less than 100 percent impaired, limited or restricted   Mobility: Walking and Moving Around Goal Status [phone]) At least 80 percent but less than 100 percent impaired, limited or restricted   Mobility: Walking and Moving Around Discharge Status [phone]) At least 80 percent but less than 100 percent impaired, limited or restricted       Problem List There are no active problems to display for this patient.   [place], PT 09/26/16, 12:25 PM  [facility] [address] [place], [place], [address] Phone: [phone]   Fax:  [phone]  Name:  Amber [name] MRN: [id number] Date of Birth: [birth year]

## 2016-08-31 NOTE — Telephone Encounter (Signed)
Gave lab another copy of Pt's instruction letter to take with her after her blood work.  Will monitor lab results to see if any intervention needed prior to cath on Monday 09/03/2016.

## 2016-09-03 ENCOUNTER — Inpatient Hospital Stay (HOSPITAL_COMMUNITY)
Admission: AD | Admit: 2016-09-03 | Discharge: 2016-09-14 | DRG: 286 | Disposition: A | Discharge status: Home / Self Care | Payer: Medicare Other | Source: Ambulatory Visit | Attending: Cardiovascular Disease | Admitting: Cardiovascular Disease

## 2016-09-03 ENCOUNTER — Encounter (HOSPITAL_COMMUNITY)
Admission: AD | Disposition: A | Discharge status: Home / Self Care | Payer: Self-pay | Source: Ambulatory Visit | Attending: Cardiovascular Disease

## 2016-09-03 ENCOUNTER — Ambulatory Visit (HOSPITAL_COMMUNITY): Payer: Medicare Other

## 2016-09-03 ENCOUNTER — Encounter (HOSPITAL_COMMUNITY): Payer: Self-pay | Admitting: Cardiovascular Disease

## 2016-09-03 DIAGNOSIS — R0602 Shortness of breath: Secondary | ICD-10-CM

## 2016-09-03 DIAGNOSIS — I130 Hypertensive heart and chronic kidney disease with heart failure and stage 1 through stage 4 chronic kidney disease, or unspecified chronic kidney disease: Secondary | ICD-10-CM | POA: Diagnosis present

## 2016-09-03 DIAGNOSIS — I350 Nonrheumatic aortic (valve) stenosis: Secondary | ICD-10-CM

## 2016-09-03 DIAGNOSIS — J90 Pleural effusion, not elsewhere classified: Secondary | ICD-10-CM

## 2016-09-03 DIAGNOSIS — Z880 Allergy status to penicillin: Secondary | ICD-10-CM

## 2016-09-03 DIAGNOSIS — Z79899 Other long term (current) drug therapy: Secondary | ICD-10-CM

## 2016-09-03 DIAGNOSIS — Z6827 Body mass index (BMI) 27.0-27.9, adult: Secondary | ICD-10-CM

## 2016-09-03 DIAGNOSIS — Z833 Family history of diabetes mellitus: Secondary | ICD-10-CM

## 2016-09-03 DIAGNOSIS — I2510 Atherosclerotic heart disease of native coronary artery without angina pectoris: Secondary | ICD-10-CM | POA: Diagnosis present

## 2016-09-03 DIAGNOSIS — N179 Acute kidney failure, unspecified: Secondary | ICD-10-CM | POA: Diagnosis not present

## 2016-09-03 DIAGNOSIS — E1122 Type 2 diabetes mellitus with diabetic chronic kidney disease: Secondary | ICD-10-CM | POA: Diagnosis present

## 2016-09-03 DIAGNOSIS — I352 Nonrheumatic aortic (valve) stenosis with insufficiency: Principal | ICD-10-CM | POA: Diagnosis present

## 2016-09-03 DIAGNOSIS — Z7982 Long term (current) use of aspirin: Secondary | ICD-10-CM

## 2016-09-03 DIAGNOSIS — N184 Chronic kidney disease, stage 4 (severe): Secondary | ICD-10-CM | POA: Diagnosis present

## 2016-09-03 DIAGNOSIS — I272 Pulmonary hypertension, unspecified: Secondary | ICD-10-CM | POA: Diagnosis present

## 2016-09-03 DIAGNOSIS — N189 Chronic kidney disease, unspecified: Secondary | ICD-10-CM

## 2016-09-03 DIAGNOSIS — N2581 Secondary hyperparathyroidism of renal origin: Secondary | ICD-10-CM | POA: Diagnosis present

## 2016-09-03 DIAGNOSIS — I5033 Acute on chronic diastolic (congestive) heart failure: Secondary | ICD-10-CM | POA: Diagnosis present

## 2016-09-03 DIAGNOSIS — E669 Obesity, unspecified: Secondary | ICD-10-CM | POA: Diagnosis present

## 2016-09-03 DIAGNOSIS — D631 Anemia in chronic kidney disease: Secondary | ICD-10-CM | POA: Diagnosis present

## 2016-09-03 DIAGNOSIS — K5900 Constipation, unspecified: Secondary | ICD-10-CM | POA: Diagnosis present

## 2016-09-03 DIAGNOSIS — E785 Hyperlipidemia, unspecified: Secondary | ICD-10-CM | POA: Diagnosis present

## 2016-09-03 HISTORY — DX: Abnormal finding of blood chemistry, unspecified: R79.9

## 2016-09-03 HISTORY — PX: RIGHT/LEFT HEART CATH AND CORONARY ANGIOGRAPHY: CATH118266

## 2016-09-03 HISTORY — DX: Abnormal levels of other serum enzymes: R74.8

## 2016-09-03 LAB — POCT I-STAT 3, VENOUS BLOOD GAS (G3P V)
Acid-Base Excess: 1 mmol/L (ref 0.0–2.0)
BICARBONATE: 27.1 mmol/L (ref 20.0–28.0)
O2 Saturation: 65 %
PH VEN: 7.354 (ref 7.250–7.430)
TCO2: 29 mmol/L (ref 0–100)
pCO2, Ven: 48.7 mmHg (ref 44.0–60.0)
pO2, Ven: 36 mmHg (ref 32.0–45.0)

## 2016-09-03 LAB — POCT I-STAT 3, ART BLOOD GAS (G3+)
Bicarbonate: 25.3 mmol/L (ref 20.0–28.0)
O2 Saturation: 90 %
PCO2 ART: 44.7 mmHg (ref 32.0–48.0)
PH ART: 7.361 (ref 7.350–7.450)
PO2 ART: 62 mmHg — AB (ref 83.0–108.0)
TCO2: 27 mmol/L (ref 0–100)

## 2016-09-03 LAB — GLUCOSE, CAPILLARY
GLUCOSE-CAPILLARY: 87 mg/dL (ref 65–99)
GLUCOSE-CAPILLARY: 94 mg/dL (ref 65–99)
GLUCOSE-CAPILLARY: 117 mg/dL — AB (ref 65–99)
Glucose-Capillary: 94 mg/dL (ref 65–99)

## 2016-09-03 SURGERY — RIGHT/LEFT HEART CATH AND CORONARY ANGIOGRAPHY
Anesthesia: LOCAL

## 2016-09-03 MED ORDER — LIDOCAINE HCL (PF) 1 % IJ SOLN
INTRAMUSCULAR | Status: DC | PRN
  Administered 2016-09-03 (×2): 2 mL

## 2016-09-03 MED ORDER — SODIUM CHLORIDE 0.9 % IV SOLN: 250.0000 mL | INTRAVENOUS | Status: DC | PRN

## 2016-09-03 MED ORDER — ASPIRIN 81 MG PO CHEW
81.0000 mg | CHEWABLE_TABLET | ORAL | Status: DC
Start: 2016-09-04 — End: 2016-09-03

## 2016-09-03 MED ORDER — FUROSEMIDE 10 MG/ML IJ SOLN
INTRAMUSCULAR | Status: DC | PRN
  Administered 2016-09-03: 40 mg via INTRAVENOUS

## 2016-09-03 MED ORDER — SODIUM CHLORIDE 0.9% FLUSH: 3.0000 mL | INTRAVENOUS | Status: DC | PRN

## 2016-09-03 MED ORDER — FUROSEMIDE 10 MG/ML IJ SOLN
INTRAMUSCULAR | Status: AC
  Filled 2016-09-03: qty 4

## 2016-09-03 MED ORDER — SODIUM CHLORIDE 0.9 % IV SOLN: INTRAVENOUS | Status: AC

## 2016-09-03 MED ORDER — VERAPAMIL HCL 2.5 MG/ML IV SOLN
INTRAVENOUS | Status: AC
  Filled 2016-09-03: qty 2

## 2016-09-03 MED ORDER — FUROSEMIDE 10 MG/ML IJ SOLN
40.0000 mg | Freq: Two times a day (BID) | INTRAMUSCULAR | Status: DC
Start: 2016-09-03 — End: 2016-09-03

## 2016-09-03 MED ORDER — SODIUM CHLORIDE 0.9% FLUSH
3.0000 mL | Freq: Two times a day (BID) | INTRAVENOUS | Status: DC
  Administered 2016-09-03 – 2016-09-14 (×14): 3 mL via INTRAVENOUS

## 2016-09-03 MED ORDER — ASPIRIN EC 81 MG PO TBEC
81.0000 mg | DELAYED_RELEASE_TABLET | Freq: Every day | ORAL | Status: DC
  Administered 2016-09-04 – 2016-09-14 (×11): 81 mg via ORAL
  Filled 2016-09-03 (×11): qty 1

## 2016-09-03 MED ORDER — HYDRALAZINE HCL 25 MG PO TABS
25.0000 mg | ORAL_TABLET | Freq: Three times a day (TID) | ORAL | Status: DC
  Administered 2016-09-03 – 2016-09-14 (×34): 25 mg via ORAL
  Filled 2016-09-03 (×34): qty 1

## 2016-09-03 MED ORDER — VITAMIN C 500 MG PO TABS
500.0000 mg | ORAL_TABLET | Freq: Every day | ORAL | Status: DC
  Administered 2016-09-03 – 2016-09-14 (×12): 500 mg via ORAL
  Filled 2016-09-03 (×12): qty 1

## 2016-09-03 MED ORDER — HEPARIN (PORCINE) IN NACL 2-0.9 UNIT/ML-% IJ SOLN
INTRAMUSCULAR | Status: AC
  Filled 2016-09-03: qty 1000

## 2016-09-03 MED ORDER — MIDAZOLAM HCL 2 MG/2ML IJ SOLN
INTRAMUSCULAR | Status: AC
  Filled 2016-09-03: qty 2

## 2016-09-03 MED ORDER — LIDOCAINE HCL (PF) 1 % IJ SOLN
INTRAMUSCULAR | Status: AC
  Filled 2016-09-03: qty 30

## 2016-09-03 MED ORDER — ACETAMINOPHEN 325 MG PO TABS: 650.0000 mg | ORAL_TABLET | ORAL | Status: DC | PRN

## 2016-09-03 MED ORDER — SODIUM CHLORIDE 0.9 % IV SOLN
INTRAVENOUS | Status: DC
  Administered 2016-09-03 (×2): via INTRAVENOUS

## 2016-09-03 MED ORDER — FUROSEMIDE 10 MG/ML IJ SOLN
40.0000 mg | Freq: Two times a day (BID) | INTRAMUSCULAR | Status: DC
  Administered 2016-09-03 – 2016-09-04 (×2): 40 mg via INTRAVENOUS
  Filled 2016-09-03 (×2): qty 4

## 2016-09-03 MED ORDER — HEPARIN (PORCINE) IN NACL 2-0.9 UNIT/ML-% IJ SOLN
INTRAMUSCULAR | Status: AC | PRN
  Administered 2016-09-03: 1000 mL

## 2016-09-03 MED ORDER — NEBIVOLOL HCL 5 MG PO TABS
2.5000 mg | ORAL_TABLET | Freq: Every day | ORAL | Status: DC
  Administered 2016-09-04 – 2016-09-14 (×11): 2.5 mg via ORAL
  Filled 2016-09-03 (×13): qty 1

## 2016-09-03 MED ORDER — MIDAZOLAM HCL 2 MG/2ML IJ SOLN
INTRAMUSCULAR | Status: DC | PRN
  Administered 2016-09-03: 0.5 mg via INTRAVENOUS

## 2016-09-03 MED ORDER — HEPARIN SODIUM (PORCINE) 1000 UNIT/ML IJ SOLN
INTRAMUSCULAR | Status: DC | PRN
  Administered 2016-09-03: 4000 [IU] via INTRAVENOUS

## 2016-09-03 MED ORDER — CHOLECALCIFEROL 10 MCG (400 UNIT) PO TABS
400.0000 [IU] | ORAL_TABLET | Freq: Every day | ORAL | Status: DC
  Administered 2016-09-04 – 2016-09-14 (×10): 400 [IU] via ORAL
  Filled 2016-09-03 (×13): qty 1

## 2016-09-03 MED ORDER — AMLODIPINE BESYLATE 10 MG PO TABS
10.0000 mg | ORAL_TABLET | Freq: Every day | ORAL | Status: DC
  Administered 2016-09-03 – 2016-09-14 (×12): 10 mg via ORAL
  Filled 2016-09-03 (×9): qty 1
  Filled 2016-09-03: qty 2
  Filled 2016-09-03 (×2): qty 1

## 2016-09-03 MED ORDER — ONDANSETRON HCL 4 MG/2ML IJ SOLN: 4.0000 mg | Freq: Four times a day (QID) | INTRAMUSCULAR | Status: DC | PRN

## 2016-09-03 MED ORDER — ALBUTEROL SULFATE (2.5 MG/3ML) 0.083% IN NEBU
2.5000 mg | INHALATION_SOLUTION | Freq: Four times a day (QID) | RESPIRATORY_TRACT | Status: DC | PRN
  Administered 2016-09-04 – 2016-09-09 (×3): 2.5 mg via RESPIRATORY_TRACT
  Filled 2016-09-03 (×6): qty 3

## 2016-09-03 MED ORDER — ROSUVASTATIN CALCIUM 10 MG PO TABS
20.0000 mg | ORAL_TABLET | Freq: Every day | ORAL | Status: DC
  Administered 2016-09-03 – 2016-09-14 (×12): 20 mg via ORAL
  Filled 2016-09-03 (×6): qty 2
  Filled 2016-09-03: qty 1
  Filled 2016-09-03 (×6): qty 2

## 2016-09-03 MED ORDER — IOPAMIDOL (ISOVUE-370) INJECTION 76%
INTRAVENOUS | Status: AC
  Filled 2016-09-03: qty 100

## 2016-09-03 MED ORDER — VERAPAMIL HCL 2.5 MG/ML IV SOLN
INTRAVENOUS | Status: DC | PRN
  Administered 2016-09-03: 11:00:00 via INTRA_ARTERIAL

## 2016-09-03 MED ORDER — SODIUM CHLORIDE 0.9% FLUSH: 3.0000 mL | Freq: Two times a day (BID) | INTRAVENOUS | Status: DC

## 2016-09-03 MED ORDER — IOPAMIDOL (ISOVUE-370) INJECTION 76%
INTRAVENOUS | Status: DC | PRN
  Administered 2016-09-03: 40 mL via INTRA_ARTERIAL

## 2016-09-03 MED ORDER — HEPARIN SODIUM (PORCINE) 1000 UNIT/ML IJ SOLN
INTRAMUSCULAR | Status: AC
  Filled 2016-09-03: qty 1

## 2016-09-03 SURGICAL SUPPLY — 14 items
CATH 5FR JL3.5 JR4 ANG PIG MP (CATHETERS) ×2 IMPLANT
CATH BALLN WEDGE 5F 110CM (CATHETERS) ×2 IMPLANT
CATH INFINITI 5FR AL1 (CATHETERS) ×2 IMPLANT
DEVICE RAD COMP TR BAND LRG (VASCULAR PRODUCTS) ×2 IMPLANT
GLIDESHEATH SLEND SS 6F .021 (SHEATH) ×2 IMPLANT
GUIDEWIRE INQWIRE 1.5J.035X260 (WIRE) ×1 IMPLANT
INQWIRE 1.5J .035X260CM (WIRE) ×2
KIT HEART LEFT (KITS) ×2 IMPLANT
PACK CARDIAC CATHETERIZATION (CUSTOM PROCEDURE TRAY) ×2 IMPLANT
SHEATH FAST CATH BRACH 5F 5CM (SHEATH) IMPLANT
SHEATH GLIDE SLENDER 4/5FR (SHEATH) ×2 IMPLANT
TRANSDUCER W/STOPCOCK (MISCELLANEOUS) ×2 IMPLANT
TUBING CIL FLEX 10 FLL-RA (TUBING) ×2 IMPLANT
WIRE EMERALD ST .035X150CM (WIRE) ×2 IMPLANT

## 2016-09-03 NOTE — Progress Notes (Signed)
Pt up to bathroom with daughter assisting.  I had not assessed her prior to her return to the bed but upon return pt is wheezy and SOB.  O2 sat 92 on admission. Pt denies taking oxygen at home.  Used home albuterol inhaler stating this is how she always is at home and respirations have settled now to 20/min but O2 sats 88-89.  Started pt on O2 at 2 liter. Also set IV fluids at 10/hour for now and called Rennis Harding from the cath lab. She will contact Dr Angelena Form to verify fluid rate.

## 2016-09-03 NOTE — Interval H&P Note (Signed)
History and Physical Interval Note:  09/03/2016 10:36 AM  Amber [name]  has presented today for cardiac cath with the diagnosis of aortic stenosis  The various methods of treatment have been discussed with the patient and family. After consideration of risks, benefits and other options for treatment, the patient has consented to  Procedure(s): Right/Left Heart Cath and Coronary Angiography (N/A) as a surgical intervention .  The patient's history has been reviewed, patient examined, no change in status, stable for surgery.  I have reviewed the patient's chart and labs.  Questions were answered to the patient's satisfaction.    Cath Lab Visit (complete for each Cath Lab visit)  Clinical Evaluation Leading to the Procedure:   ACS: No.  Non-ACS:    Anginal Classification: CCS II  Anti-ischemic medical therapy: No Therapy  Non-Invasive Test Results: No non-invasive testing performed  Prior CABG: No previous CABG         [name]

## 2016-09-03 NOTE — H&P (View-Only) (Signed)
Chief Complaint  Patient presents with  . New Evaluation    severe aortic valve stenosis    History of Present Illness: 83 yo female with history of diabetes mellitus, hypertension, hyperlipidemia, chronic kidney disease and severe aortic stenosis with moderate aortic regurgitation who is here today as a new consult for evaluation of her aortic valve disease. She is followed in [place] [facility] by Dr. Woody Seller who has referred her to our office today to discuss TAVR. She has had progressive dyspnea with exertion for the last several months. She has been on Lasix for lower extremity edema. She also has dizziness with near syncope. Echocardiogram in the [facility] office march 13,2018 showed mildly dilated LV with concentric hypertrophy of the LV. There was grade 2 diastolic dysfunction. LVEF >65%. There was severe aortic valve stenosis with mean gradient of 47 mmHg, peak gradient of 76 mmHg, AVA 0.74 cm2. There is mild mitral regurgitation and moderate tricuspid regurgitation.   She is here today with her family. 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 She lives in her private house and her grandson lives with her. She can walk about fifty feet before having dyspnea. She has had LE edema. She has had dizziness dating back to February 2018. Near syncope in February. No true syncopal events over last 6 months.    Primary Care Physician: Jilda Panda, MD Primary Cardiologist: Dr. Vear Clock [facility]) Referring: Woody Seller  Past Medical History:  Diagnosis Date  . Aortic stenosis   . Chronic diastolic CHF (congestive heart failure) ([place])   . Diabetes mellitus without complication ([place])   . High cholesterol   . Hypertension     Past Surgical History:  Procedure Laterality Date  . ABDOMINAL HYSTERECTOMY      Current Outpatient Prescriptions  Medication Sig Dispense Refill  . amLODipine-benazepril (LOTREL) 10-40 MG capsule Take 1 capsule by  mouth daily.    Marland Kitchen BIOTIN 5000 PO Take 5,000 mcg by mouth daily.    . cholecalciferol (VITAMIN D) 400 units TABS tablet Take 400 Units by mouth daily.    . furosemide (LASIX) 40 MG tablet Take 40 mg by mouth every Monday, Wednesday, and Friday.    . nebivolol (BYSTOLIC) 5 MG tablet Take 5 mg by mouth daily. 1/2 a tablet    . rosuvastatin (CRESTOR) 20 MG tablet Take 20 mg by mouth daily.    . vitamin C (ASCORBIC ACID) 500 MG tablet Take 500 mg by mouth daily.    . meclizine (ANTIVERT) 25 MG tablet Take 0.5-1 tablets (12.5-25 mg total) by mouth 3 (three) times daily as needed for dizziness. (Patient not taking: Reported on 08/22/2016) 15 tablet 0   No current facility-administered medications for this visit.     Allergies  Allergen Reactions  . Penicillins Rash    Social History   Social History  . Marital status: Single    Spouse name: N/A  . Number of children: 2  . Years of education: N/A   Occupational History  . Hosiery mill, school cafeteria-retired    Social History Main Topics  . Smoking status: Never Smoker  . Smokeless tobacco: Never Used  . Alcohol use No  . Drug use: No  . Sexual activity: Not on file   Other Topics Concern  . Not on file   Social History Narrative  . No narrative on file    Family History  Problem Relation Age of Onset  . Diabetes Mother   .  Cancer Father     Review of Systems:  As stated in the HPI and otherwise negative.   BP (!) 148/60   Pulse (!) 55   Ht 5\' 6"  (1.676 m)   Wt 174 lb (78.9 kg)   SpO2 92%   BMI 28.08 kg/m   Physical Examination: General: Elderly female in NAD.   HEENT: OP clear, mucus membranes moist  SKIN: warm, dry. No rashes. Neuro: No focal deficits  Musculoskeletal: Muscle strength 5/5 all ext  Psychiatric: Mood and affect normal  Neck: No JVD, no carotid bruits, no thyromegaly, no lymphadenopathy.  Lungs:Clear bilaterally, no wheezes, rhonci, crackles Cardiovascular: Regular rate and rhythm. Loud  harsh systolic murmur.  Abdomen:Soft. Bowel sounds present. Non-tender.  Extremities: Trace bilateral lower extremity edema. Pulses are 2 + in the bilateral DP/PT.  EKG:  EKG is ordered today. The ekg ordered today demonstrates Sinus brady, rate 54 bpm. LAD. IVCD  Recent Labs: 06/18/2016: ALT 9 06/20/2016: BUN 36; Creatinine, Ser 2.16; Hemoglobin 10.7; Platelets 165; Potassium 4.5; Sodium 137   Lipid Panel No results found for: CHOL, TRIG, HDL, CHOLHDL, VLDL, LDLCALC, LDLDIRECT   Wt Readings from Last 3 Encounters:  08/22/16 174 lb (78.9 kg)  12/03/12 168 lb (76.2 kg)     Other studies Reviewed: Additional studies/ records that were reviewed today include: . Review of the above records demonstrates:   STS Risk Score:  Risk of Mortality: 11.007%  Morbidity or Mortality: 41.822%  Long Length of Stay: 27.301%  Short Length of Stay: 5.687%  Permanent Stroke: 4.964%  Prolonged Ventilation: 30.779%  DSW Infection: 0.231%  Renal Failure: 24.74%  Reoperation: 12.085%    Assessment and Plan:   1. Severe aortic valve stenosis: She has stage D symptomatic aortic valve stenosis. I cannot review her echo images from [facility]. The report indicates that the aortic valve is thickened, calcified with limited leaflet mobility. I think she would benefit from AVR. Given advanced age, she is not a good candidate for conventional AVR by surgical approach. I think she may be a good candidate for TAVR. She is a very functional 83 yo patient. I have reviewed the TAVR procedure in detail today with the patient and her family. She would like to proceed with planning for TAVR. I will arrange an echo to assess her current LV function and aortic valve gradient. I will arrange a right and left heart catheterization at [facility] on 09/03/16 at 10:30am Risks and benefits of procedure reviewed with the patient. After the cath, she will be referred to see one of the CT surgeons on our TAVR team. She will then  have Cardiac CT and CTA of the chest/abd/pelvis which will need to be staged due to her chronic kidney disease. She will also need PFTs and carotid dopplers.    Current medicines are reviewed at length with the patient today.  The patient does not have concerns regarding medicines.  The following changes have been made:  no change  Labs/ tests ordered today include:   Orders Placed This Encounter  Procedures  . Basic Metabolic Panel (BMET)  . CBC w/Diff  . INR/PT  . EKG 12-Lead  . ECHOCARDIOGRAM COMPLETE     Disposition:   Cardiac cath then f/u with TAVR team.    Signed, [name], MD 08/22/2016 3:45 PM    [address], [place], [place]  [address] Phone: [phone]; Fax: [phone]

## 2016-09-03 NOTE — H&P (Signed)
Patient ID: Amber [name] MRN: [id number] DOB/AGE: 04/14/1921 83 y.o. Admit date: 09/03/2016  Primary Care Physician: Jilda Panda, MD' Primary Cardiologist: Dr. Dian [facility] (TAVR)  HPI: 83 yo female with history of diabetes mellitus, hypertension, hyperlipidemia, chronic kidney disease, chronic diastolic CHF and severe aortic stenosis with moderate aortic regurgitation who is being admitted today post cath for diuresis. I met her in my office 2 weeks ago as a new consult for evaluation of her aortic valve disease. She is followed in [facility] by Dr. Woody Seller who has referred her to our office to discuss TAVR. She has had progressive dyspnea with exertion for the last several months. She has been on Lasix for lower extremity edema with known stage 3 CKD. She also has dizziness with near syncope. Echocardiogram in the [facility] office march 13,2018 showed mildly dilated LV with concentric hypertrophy of the LV. There was grade 2 diastolic dysfunction. LVEF >65%. There was severe aortic valve stenosis with mean gradient of 47 mmHg, peak gradient of 76 mmHg, AVA 0.74 cm2. There is mild mitral regurgitation and moderate tricuspid regurgitation. Echo in our office 08/31/16 with severe AS with mean gradient of 29mH, peak gradient of 155 mmHg and AVA of 0.61 cm2. There was moderate AI and TR. LV systolic function normal.   She has continued to have dyspnea and wheezing today along with orthopnea. Cardiac cath today without obstructive CAD but her filling pressures are severely elevated. She will be admitted for diuresis today.   Review of systems complete and found to be negative unless listed above   Past Medical History:  Diagnosis Date  . Aortic stenosis   . Chronic diastolic CHF (congestive heart failure) ([place]   . Diabetes mellitus without complication ([place]   . High cholesterol   . Hypertension     Family History  Problem Relation Age of Onset  .  Diabetes Mother   . Cancer Father     Social History   Social History  . Marital status: Single    Spouse name: N/A  . Number of children: 2  . Years of education: N/A   Occupational History  . [employer], school cafeteria-retired    Social History Main Topics  . Smoking status: Never Smoker  . Smokeless tobacco: Never Used  . Alcohol use No  . Drug use: No  . Sexual activity: Not on file   Other Topics Concern  . Not on file   Social History Narrative  . No narrative on file    Past Surgical History:  Procedure Laterality Date  . ABDOMINAL HYSTERECTOMY      Allergies  Allergen Reactions  . Penicillins Rash    Prior to Admission Meds:  Prior to Admission medications   Medication Sig Start Date End Date Taking? Authorizing Provider  albuterol (PROVENTIL HFA;VENTOLIN HFA) 108 (90 Base) MCG/ACT inhaler Inhale 3 puffs into the lungs every 6 (six) hours as needed for wheezing or shortness of breath.   Yes [provider]  amLODipine-benazepril (LOTREL) 10-40 MG capsule Take 1 capsule by mouth daily.   Yes [provider]  aspirin EC 81 MG tablet Take 81 mg by mouth daily.   Yes [provider]  BIOTIN 5000 PO Take 5,000 mcg by mouth daily.   Yes [provider]  cholecalciferol (VITAMIN D) 400 units TABS tablet Take 400 Units by mouth daily.   Yes [provider]  furosemide (LASIX) 40 MG tablet Take 40 mg by  mouth every other day.    Yes [provider]  meclizine (ANTIVERT) 25 MG tablet Take 0.5-1 tablets (12.5-25 mg total) by mouth 3 (three) times daily as needed for dizziness. 06/20/16  Yes Mesner, Corene Cornea, MD  nebivolol (BYSTOLIC) 5 MG tablet Take 2.5 mg by mouth daily.    Yes [provider]  rosuvastatin (CRESTOR) 20 MG tablet Take 20 mg by mouth daily.   Yes [provider]  vitamin C (ASCORBIC ACID) 500 MG tablet Take 500 mg by mouth daily.   Yes [provider]    Physical  Exam: Blood pressure (!) 168/76, pulse (!) 55, resp. rate 15, SpO2 90 %.    General: Well developed, well nourished, NAD  HEENT: OP clear, mucus membranes moist  SKIN: warm, dry. No rashes.  Neuro: No focal deficits  Musculoskeletal: Muscle strength 5/5 all ext  Psychiatric: Mood and affect normal  Neck: No JVD, no carotid bruits, no thyromegaly, no lymphadenopathy.  Lungs:Clear bilaterally, no wheezes, rhonci, crackles  Cardiovascular: Regular rate and rhythm. Loud harsh systolic murmur noted.   Abdomen:Soft. Bowel sounds present. Non-tender.  Extremities: Trace bilateral lower extremity edema. Pulses are 2 + in the bilateral DP/PT.   Labs:   Lab Results  Component Value Date   WBC 7.0 08/31/2016   HGB 10.8 (L) 08/31/2016   HCT 31.9 (L) 08/31/2016   MCV 83 08/31/2016   PLT 181 08/31/2016    Recent Labs Lab 08/31/16 1314  NA 142  K 4.7  CL 104  CO2 27  BUN 28  CREATININE 2.11*  CALCIUM 9.4  GLUCOSE 140*     ASSESSMENT AND PLAN:   1. Severe aortic valve stenosis 2. Mild non-obstructive CAD 3. Acute on chronic diastolic CHF with orthopnea  Will admit to telemetry unit today and begin diuresis with IV Lasix. Follow renal function closely. Chest x-ray today. REmainder of TAVR workup as an outpatient. I anticipate 24-48 hours of hospitalization for diuresis.   Darlina Guys, MD 09/03/2016, 11:48 AM

## 2016-09-04 DIAGNOSIS — I10 Essential (primary) hypertension: Secondary | ICD-10-CM

## 2016-09-04 DIAGNOSIS — I2510 Atherosclerotic heart disease of native coronary artery without angina pectoris: Secondary | ICD-10-CM | POA: Diagnosis present

## 2016-09-04 DIAGNOSIS — Z880 Allergy status to penicillin: Secondary | ICD-10-CM | POA: Diagnosis not present

## 2016-09-04 DIAGNOSIS — E785 Hyperlipidemia, unspecified: Secondary | ICD-10-CM | POA: Diagnosis present

## 2016-09-04 DIAGNOSIS — Z7982 Long term (current) use of aspirin: Secondary | ICD-10-CM | POA: Diagnosis not present

## 2016-09-04 DIAGNOSIS — R062 Wheezing: Secondary | ICD-10-CM | POA: Diagnosis present

## 2016-09-04 DIAGNOSIS — Z01810 Encounter for preprocedural cardiovascular examination: Secondary | ICD-10-CM | POA: Diagnosis not present

## 2016-09-04 DIAGNOSIS — E669 Obesity, unspecified: Secondary | ICD-10-CM | POA: Diagnosis present

## 2016-09-04 DIAGNOSIS — I359 Nonrheumatic aortic valve disorder, unspecified: Secondary | ICD-10-CM

## 2016-09-04 DIAGNOSIS — I509 Heart failure, unspecified: Secondary | ICD-10-CM

## 2016-09-04 DIAGNOSIS — I5033 Acute on chronic diastolic (congestive) heart failure: Secondary | ICD-10-CM | POA: Diagnosis present

## 2016-09-04 DIAGNOSIS — D631 Anemia in chronic kidney disease: Secondary | ICD-10-CM | POA: Diagnosis present

## 2016-09-04 DIAGNOSIS — I350 Nonrheumatic aortic (valve) stenosis: Secondary | ICD-10-CM

## 2016-09-04 DIAGNOSIS — E1122 Type 2 diabetes mellitus with diabetic chronic kidney disease: Secondary | ICD-10-CM | POA: Diagnosis present

## 2016-09-04 DIAGNOSIS — K5900 Constipation, unspecified: Secondary | ICD-10-CM | POA: Diagnosis present

## 2016-09-04 DIAGNOSIS — Z6827 Body mass index (BMI) 27.0-27.9, adult: Secondary | ICD-10-CM | POA: Diagnosis not present

## 2016-09-04 DIAGNOSIS — N184 Chronic kidney disease, stage 4 (severe): Secondary | ICD-10-CM | POA: Diagnosis present

## 2016-09-04 DIAGNOSIS — I5043 Acute on chronic combined systolic (congestive) and diastolic (congestive) heart failure: Secondary | ICD-10-CM | POA: Diagnosis not present

## 2016-09-04 DIAGNOSIS — I130 Hypertensive heart and chronic kidney disease with heart failure and stage 1 through stage 4 chronic kidney disease, or unspecified chronic kidney disease: Secondary | ICD-10-CM | POA: Diagnosis present

## 2016-09-04 DIAGNOSIS — I272 Pulmonary hypertension, unspecified: Secondary | ICD-10-CM | POA: Diagnosis present

## 2016-09-04 DIAGNOSIS — Z79899 Other long term (current) drug therapy: Secondary | ICD-10-CM | POA: Diagnosis not present

## 2016-09-04 DIAGNOSIS — N183 Chronic kidney disease, stage 3 (moderate): Secondary | ICD-10-CM

## 2016-09-04 DIAGNOSIS — Z833 Family history of diabetes mellitus: Secondary | ICD-10-CM | POA: Diagnosis not present

## 2016-09-04 DIAGNOSIS — N2581 Secondary hyperparathyroidism of renal origin: Secondary | ICD-10-CM | POA: Diagnosis present

## 2016-09-04 DIAGNOSIS — I060 Rheumatic aortic stenosis: Secondary | ICD-10-CM | POA: Diagnosis not present

## 2016-09-04 DIAGNOSIS — N179 Acute kidney failure, unspecified: Secondary | ICD-10-CM | POA: Diagnosis not present

## 2016-09-04 DIAGNOSIS — I352 Nonrheumatic aortic (valve) stenosis with insufficiency: Secondary | ICD-10-CM | POA: Diagnosis present

## 2016-09-04 LAB — BASIC METABOLIC PANEL
Anion gap: 9 (ref 5–15)
BUN: 27 mg/dL — AB (ref 6–20)
CHLORIDE: 105 mmol/L (ref 101–111)
CO2: 26 mmol/L (ref 22–32)
CREATININE: 2.19 mg/dL — AB (ref 0.44–1.00)
Calcium: 8.8 mg/dL — ABNORMAL LOW (ref 8.9–10.3)
GFR calc Af Amer: 21 mL/min — ABNORMAL LOW (ref >60)
GFR calc non Af Amer: 18 mL/min — ABNORMAL LOW (ref >60)
GLUCOSE: 91 mg/dL (ref 65–99)
POTASSIUM: 4.5 mmol/L (ref 3.5–5.1)
SODIUM: 140 mmol/L (ref 135–145)

## 2016-09-04 LAB — CBC
HCT: 32.5 % — ABNORMAL LOW (ref 36.0–46.0)
HEMOGLOBIN: 10.1 g/dL — AB (ref 12.0–15.0)
MCH: 28.2 pg (ref 26.0–34.0)
MCHC: 31.1 g/dL (ref 30.0–36.0)
MCV: 90.8 fL (ref 78.0–100.0)
PLATELETS: 147 10*3/uL — AB (ref 150–400)
RBC: 3.58 MIL/uL — AB (ref 3.87–5.11)
RDW: 15.3 % (ref 11.5–15.5)
WBC: 6.7 10*3/uL (ref 4.0–10.5)

## 2016-09-04 LAB — GLUCOSE, CAPILLARY
GLUCOSE-CAPILLARY: 117 mg/dL — AB (ref 65–99)
Glucose-Capillary: 83 mg/dL (ref 65–99)
Glucose-Capillary: 108 mg/dL — ABNORMAL HIGH (ref 65–99)
Glucose-Capillary: 162 mg/dL — ABNORMAL HIGH (ref 65–99)

## 2016-09-04 MED ORDER — ENSURE ENLIVE PO LIQD: 237.0000 mL | Freq: Two times a day (BID) | ORAL | Status: DC

## 2016-09-04 MED ORDER — FUROSEMIDE 10 MG/ML IJ SOLN: 80.0000 mg | Freq: Two times a day (BID) | INTRAMUSCULAR | Status: DC

## 2016-09-04 MED ORDER — FUROSEMIDE 10 MG/ML IJ SOLN
80.0000 mg | Freq: Two times a day (BID) | INTRAMUSCULAR | Status: DC
  Administered 2016-09-05: 80 mg via INTRAVENOUS
  Filled 2016-09-04: qty 8

## 2016-09-04 MED ORDER — DOCUSATE SODIUM 100 MG PO CAPS
200.0000 mg | ORAL_CAPSULE | Freq: Every day | ORAL | Status: DC
  Administered 2016-09-04 – 2016-09-14 (×11): 200 mg via ORAL
  Filled 2016-09-04 (×11): qty 2

## 2016-09-04 MED ORDER — GLUCERNA SHAKE PO LIQD
237.0000 mL | Freq: Two times a day (BID) | ORAL | Status: DC
  Administered 2016-09-04 – 2016-09-14 (×16): 237 mL via ORAL
  Filled 2016-09-04 (×4): qty 237

## 2016-09-04 NOTE — Progress Notes (Signed)
Initial Nutrition Assessment  DOCUMENTATION CODES:   Not applicable  INTERVENTION:    Glucerna Shake po BID, each supplement provides 220 kcal and 10 grams of protein  NUTRITION DIAGNOSIS:   Inadequate oral intake related to poor appetite as evidenced by per patient/family report.  GOAL:   Patient will meet greater than or equal to 90% of their needs  MONITOR:   PO intake, Supplement acceptance  REASON FOR ASSESSMENT:   Consult Assessment of nutrition requirement/status  ASSESSMENT:   83 yo female with PMH of HTN, DM, HLD, CKD, CHF, aortic stenosis who was admitted on 7/23 with dyspnea and orthopnea. Admitted for diuresis.  Patient's family at bedside, report that she, "eats like a bird." Patient feels like she is eating okay, but family thinks she needs to eat more. Daughter requested Ensure supplements between meals.  Nutrition-Focused physical exam completed. Findings are no fat depletion, no muscle depletion, and no edema.  Labs reviewed. Medications reviewed and include Colace, vitamin D, Lasix, vitamin C.  Diet Order:  Diet heart healthy/carb modified Room service appropriate? Yes; Fluid consistency: Thin  Skin:  Reviewed, no issues  Last BM:  unknown  Height:   Ht Readings from Last 1 Encounters:  08/22/16 5\' 6"  (1.676 m)    Weight:   Wt Readings from Last 1 Encounters:  08/22/16 174 lb (78.9 kg)    Ideal Body Weight:  59.1 kg  BMI:  28.1  Estimated Nutritional Needs:   Kcal:  1650-1850  Protein:  80-90 gm  Fluid:  1.8 L  EDUCATION NEEDS:   No education needs identified at this time  Molli Barrows, [place], [place], [place] Pager [phone] After Hours Pager [phone]

## 2016-09-04 NOTE — Progress Notes (Signed)
Progress Note  Patient Name: Amber [name] Date of Encounter: 09/04/2016  [facility]  TAVR Dr. Angelena Form  Subjective   Feels better, but still SOB with talking and getting up.  No chest pain   Inpatient Medications    Scheduled Meds: . amLODipine  10 mg Oral Daily  . aspirin EC  81 mg Oral Daily  . cholecalciferol  400 Units Oral Daily  . furosemide  40 mg Intravenous BID  . hydrALAZINE  25 mg Oral Q8H  . nebivolol  2.5 mg Oral Daily  . rosuvastatin  20 mg Oral Daily  . sodium chloride flush  3 mL Intravenous Q12H  . vitamin C  500 mg Oral Daily   Continuous Infusions: . sodium chloride     PRN Meds: sodium chloride, acetaminophen, albuterol, ondansetron (ZOFRAN) IV, sodium chloride flush   Vital Signs    Vitals:   09/04/16 0157 09/04/16 0608 09/04/16 0611 09/04/16 0927  BP:  (!) 151/48 (!) 151/48 (!) 134/50  Pulse:    (!) 55  Resp:  20    Temp:  98.2 F (36.8 C)  98.4 F (36.9 C)  TempSrc:  Oral  Oral  SpO2: 95% 97%  100%    Intake/Output Summary (Last 24 hours) at 09/04/16 1217 Last data filed at 09/03/16 2100  Gross per 24 hour  Intake                0 ml  Output              700 ml  Net             -700 ml   There were no vitals filed for this visit.  Telemetry    SR  - Personally Reviewed  ECG    No new EKG  Personally Reviewed  Physical Exam   GEN: No acute distress.  Though SOB with talking Neck: + JVD Cardiac: RRR, 3/6 aortic murmur, no rubs, or gallops.  Respiratory: bilateral breath sounds to auscultation bilaterally with some rhonchi and rales.Marland Kitchen GI: Soft, nontender, non-distended  MS: No edema; No deformity. Rt wrist cath site without hematoma Neuro:  Nonfocal  Psych: Normal affect   Labs    Chemistry Recent Labs Lab 08/31/16 1314 09/04/16 0159  NA 142 140  K 4.7 4.5  CL 104 105  CO2 27 26  GLUCOSE 140* 91  BUN 28 27*  CREATININE 2.11* 2.19*  CALCIUM 9.4 8.8*  GFRNONAA 20*  18*  GFRAA 23* 21*  ANIONGAP  --  9     Hematology Recent Labs Lab 08/31/16 1314 09/04/16 0159  WBC 7.0 6.7  RBC 3.83 3.58*  HGB 10.8* 10.1*  HCT 31.9* 32.5*  MCV 83 90.8  MCH 28.2 28.2  MCHC 33.9 31.1  RDW 14.5 15.3  PLT 181 147*    Cardiac EnzymesNo results for input(s): TROPONINI in the last 168 hours. No results for input(s): TROPIPOC in the last 168 hours.   BNPNo results for input(s): BNP, PROBNP in the last 168 hours.   DDimer No results for input(s): DDIMER in the last 168 hours.   Radiology    Dg Chest 2 View  Result Date: 09/03/2016 CLINICAL DATA:  83 year old female with pleural effusion and shortness breath. Initial encounter. EXAM: CHEST  2 VIEW COMPARISON:  07/10/2016. FINDINGS: Worsening pulmonary edema and increasing pleural effusions greater on the right. Cardiomegaly. Calcified aorta. Thoracic kyphosis. IMPRESSION: Worsening pulmonary edema and increasing pleural effusions greater on  the right. Cardiomegaly. Aortic Atherosclerosis (ICD10-I70.0). Electronically Signed   By: Genia Del M.D.   On: 09/03/2016 14:17    Cardiac Studies   Cardiac cath 09/03/16 Procedures   Right/Left Heart Cath and Coronary Angiography  Conclusion     Mid RCA lesion, 30 %stenosed.  Mid LAD lesion, 20 %stenosed.  Mid LAD to Dist LAD lesion, 20 %stenosed.  There is severe aortic valve stenosis.  Hemodynamic findings consistent with severe pulmonary hypertension.   1. Mild non-obstructive CAD 2. Severe aortic valve stenosis (mean gradient 47.6 mmHg, peak to peak gradient 64 mmHg, AVA 1.0 cm2) 3. Severe pulmonary HTN 4. Elevated LV filling pressures.   Recommendations: I will admit her to telemetry for diuresis today given dyspnea, wheezing, LE edema and elevated filling pressures. She has severe pulm HTN as well. Will arrange chest x-ray today. Follow renal function closely post cath and with diuresis. Hold Ace-inh. Will start hydralazine and continue Norvasc. We  will plan 24-48 hours of diuresis. Will continue TAVR workup as an outpatient. She will need staged CT scans due to renal insufficiency.     Previous Echo 08/31/16 Study Conclusions  - Left ventricle: The cavity size was normal. Wall thickness was   increased in a pattern of moderate LVH. There was focal basal   hypertrophy. Systolic function was normal. The estimated ejection   fraction was in the range of 60% to 65%. Features are consistent   with a pseudonormal left ventricular filling pattern, with   concomitant abnormal relaxation and increased filling pressure   (grade 2 diastolic dysfunction). - Aortic valve: There was severe stenosis. There was moderate   regurgitation. - Right ventricle: The cavity size was mildly dilated. - Tricuspid valve: There was moderate regurgitation. - Pulmonary arteries: Systolic pressure was mildly increased. PA   peak pressure: 38 mm Hg (S). - Pericardium, extracardiac: There was a left pleural effusion.  Impressions:  - The aortic valve is severely calcified.   There is severe aortic stenosis and moderate aortic insufficiency   Normal LV function   Patient Profile     83 y.o. female history of diabetes mellitus, hypertension, hyperlipidemia, chronic kidney disease, chronic diastolic CHF and severe aortic stenosis with moderate aortic regurgitationwho is being admitted today post cath for diuresis.  Assessment & Plan    Severe aortic valve stenosis-- continue TAVR work up as outpt   Mild non obstructive CAD   Acute on chronic diastolic HF with orthopnea and severe AS negative 1100 no intake reported.  No weights, On lasix 40 mg BID  Still SOB  Severe Pulmonary HTN  CKD 4 with cr today 2.19 this  Ace on hold, hydralazine added   Anemia stable H/H at 10.1/32.5    HTN  On amlodipine,  Bystolic, hydralazine   HLD on crestor continue  Constipation add stool softener  Poor food intake will ask nutrition to see.      Signed, Cecilie Kicks, NP  09/04/2016, 12:17 PM    I have seen, examined and evaluated the patient this PM along with Cecilie Kicks, NP-C.  After reviewing all the available data and chart, we discussed the patients laboratory, study & physical findings as well as symptoms in detail. I agree with her findings, examination as well as impression recommendations as per our discussion.    Patient was admitted for severely elevated LVEDP with RV pressures also elevated. She has severe aortic stenosis, but no coronary disease. Admitted for diuresis. Initially she had brisk diuresis overnight,  but has had slow progress today. I will increase her dose of Lasix to 80 mg for this afternoon and then reassess in the morning - will anticipate at least another day or so of IV Lasix and then switch to by mouth either tomorrow noon or the next morning.  Agree with nutrition evaluation - will need adequate nourishment for postoperative recovery Blood pressure better controlled today than it was yesterday. We'll need to monitor as she is on hydralazine as opposed to her ACE inhibitor -- once her renal function improved stable on diuretic, we can restart home ACE inhibitor. Currently on hold following contrast load.  Anticipate another 1-2 days of diuresis prior to evaluation for discharge. Plan would be continued TAVR evaluation in the outpatient setting   Glenetta Hew, M.D., M.S. Interventional Cardiologist   Pager # [phone] Phone # [phone] [address] [place], [place] [address]

## 2016-09-04 NOTE — Plan of Care (Signed)
Problem: Safety: Goal: Ability to remain free from injury will improve Outcome: Progressing Pt alert and oriented/OOB one assist/BSC

## 2016-09-05 LAB — BASIC METABOLIC PANEL
ANION GAP: 9 (ref 5–15)
BUN: 34 mg/dL — AB (ref 6–20)
CHLORIDE: 105 mmol/L (ref 101–111)
CO2: 25 mmol/L (ref 22–32)
Calcium: 8.6 mg/dL — ABNORMAL LOW (ref 8.9–10.3)
Creatinine, Ser: 2.4 mg/dL — ABNORMAL HIGH (ref 0.44–1.00)
GFR calc Af Amer: 19 mL/min — ABNORMAL LOW (ref >60)
GFR calc non Af Amer: 16 mL/min — ABNORMAL LOW (ref >60)
Glucose, Bld: 87 mg/dL (ref 65–99)
POTASSIUM: 4.4 mmol/L (ref 3.5–5.1)
SODIUM: 139 mmol/L (ref 135–145)

## 2016-09-05 LAB — GLUCOSE, CAPILLARY
GLUCOSE-CAPILLARY: 91 mg/dL (ref 65–99)
GLUCOSE-CAPILLARY: 133 mg/dL — AB (ref 65–99)
GLUCOSE-CAPILLARY: 136 mg/dL — AB (ref 65–99)
Glucose-Capillary: 151 mg/dL — ABNORMAL HIGH (ref 65–99)

## 2016-09-05 MED ORDER — FUROSEMIDE 10 MG/ML IJ SOLN: 80.0000 mg | Freq: Two times a day (BID) | INTRAMUSCULAR | Status: DC

## 2016-09-05 NOTE — Plan of Care (Signed)
Problem: Safety: Goal: Ability to remain free from injury will improve Outcome: Progressing Ambulate in room. BRP. Stand by assist  Problem: Health Behavior/Discharge Planning: Goal: Ability to manage health-related needs will improve Outcome: Progressing Assess discharge needs  Problem: Nutrition: Goal: Adequate nutrition will be maintained Outcome: Progressing Poor Appetite. Tolerates Glucerna supplement

## 2016-09-05 NOTE — Progress Notes (Signed)
Progress Note  Patient Name: Amber [name] Date of Encounter: 09/05/2016  Primary Cardiologist: [facility]  Subjective   Still somewhat dyspnea, but improved today. Able to lay flat in the bed.   Inpatient Medications    Scheduled Meds: . amLODipine  10 mg Oral Daily  . aspirin EC  81 mg Oral Daily  . cholecalciferol  400 Units Oral Daily  . docusate sodium  200 mg Oral Daily  . feeding supplement (GLUCERNA SHAKE)  237 mL Oral BID BM  . furosemide  80 mg Intravenous BID  . hydrALAZINE  25 mg Oral Q8H  . nebivolol  2.5 mg Oral Daily  . rosuvastatin  20 mg Oral Daily  . sodium chloride flush  3 mL Intravenous Q12H  . vitamin C  500 mg Oral Daily   Continuous Infusions: . sodium chloride     PRN Meds: sodium chloride, acetaminophen, albuterol, ondansetron (ZOFRAN) IV, sodium chloride flush   Vital Signs    Vitals:   09/04/16 0927 09/04/16 1302 09/04/16 2127 09/05/16 0509  BP: (!) 134/50 (!) 143/44 (!) 134/55 (!) 137/53  Pulse: (!) 55 (!) 55 (!) 58 (!) 55  Resp:  18 16 20   Temp: 98.4 F (36.9 C) 98 F (36.7 C) 98.2 F (36.8 C) 98.6 F (37 C)  TempSrc: Oral Oral Oral Oral  SpO2: 100% 95% 95% 93%  Weight:    174 lb 9.7 oz (79.2 kg)    Intake/Output Summary (Last 24 hours) at 09/05/16 1024 Last data filed at 09/05/16 0515  Gross per 24 hour  Intake              730 ml  Output              200 ml  Net              530 ml   Filed Weights   09/05/16 0509  Weight: 174 lb 9.7 oz (79.2 kg)    Telemetry    SB - Personally Reviewed  ECG    N/A - Personally Reviewed  Physical Exam   General: Well developed, well nourished, older AA female appearing in no acute distress. Head: Normocephalic, atraumatic.  Neck: Supple without bruits, + JVD. Lungs:  Resp regular and unlabored, Diminished R>L bilaterally. Heart: RRR, S1, S2, no S3, S4, or 4/6 systolic murmur; no rub. Abdomen: Soft, non-tender, non-distended with normoactive bowel sounds. No  hepatomegaly. No rebound/guarding. No obvious abdominal masses. Extremities: No clubbing, cyanosis, edema. Distal pedal pulses are 2+ bilaterally. Neuro: Alert and oriented X 3. Moves all extremities spontaneously. Psych: Normal affect.  Labs    Chemistry Recent Labs Lab 08/31/16 1314 09/04/16 0159 09/05/16 0244  NA 142 140 139  K 4.7 4.5 4.4  CL 104 105 105  CO2 27 26 25   GLUCOSE 140* 91 87  BUN 28 27* 34*  CREATININE 2.11* 2.19* 2.40*  CALCIUM 9.4 8.8* 8.6*  GFRNONAA 20* 18* 16*  GFRAA 23* 21* 19*  ANIONGAP  --  9 9     Hematology Recent Labs Lab 08/31/16 1314 09/04/16 0159  WBC 7.0 6.7  RBC 3.83 3.58*  HGB 10.8* 10.1*  HCT 31.9* 32.5*  MCV 83 90.8  MCH 28.2 28.2  MCHC 33.9 31.1  RDW 14.5 15.3  PLT 181 147*    Cardiac EnzymesNo results for input(s): TROPONINI in the last 168 hours. No results for input(s): TROPIPOC in the last 168 hours.   BNPNo results for input(s): BNP, PROBNP in the  last 168 hours.   DDimer No results for input(s): DDIMER in the last 168 hours.    Radiology    Dg Chest 2 View  Result Date: 09/03/2016 CLINICAL DATA:  83 year old female with pleural effusion and shortness breath. Initial encounter. EXAM: CHEST  2 VIEW COMPARISON:  07/10/2016. FINDINGS: Worsening pulmonary edema and increasing pleural effusions greater on the right. Cardiomegaly. Calcified aorta. Thoracic kyphosis. IMPRESSION: Worsening pulmonary edema and increasing pleural effusions greater on the right. Cardiomegaly. Aortic Atherosclerosis (ICD10-I70.0). Electronically Signed   By: Genia Del M.D.   On: 09/03/2016 14:17    Cardiac Studies   R/LHC: 09/03/16  Conclusion     Mid RCA lesion, 30 %stenosed.  Mid LAD lesion, 20 %stenosed.  Mid LAD to Dist LAD lesion, 20 %stenosed.  There is severe aortic valve stenosis.  Hemodynamic findings consistent with severe pulmonary hypertension.   1. Mild non-obstructive CAD 2. Severe aortic valve stenosis (mean  gradient 47.6 mmHg, peak to peak gradient 64 mmHg, AVA 1.0 cm2) 3. Severe pulmonary HTN 4. Elevated LV filling pressures.   Recommendations: I will admit her to telemetry for diuresis today given dyspnea, wheezing, LE edema and elevated filling pressures. She has severe pulm HTN as well. Will arrange chest x-ray today. Follow renal function closely post cath and with diuresis. Hold Ace-inh. Will start hydralazine and continue Norvasc. We will plan 24-48 hours of diuresis. Will continue TAVR workup as an outpatient. She will need staged CT scans due to renal insufficiency.    Patient Profile     83 y.o. female with history of diabetes mellitus, hypertension, hyperlipidemia, chronic kidney disease, chronic diastolic CHF and severe aortic stenosis with moderate aortic regurgitation who was admitted post cath for diuresis.   Assessment & Plan    1. Acute on Chronic diastolic HF: Volume overloaded with elevated pressures on cath 09/03/16. She was admitted with plans for diuresis. Not much UOP noted, weight at 170lbs. Daughter reports dry weight at home is around 174lb. Cr up 2.1>>2.4 today. Will hold evening dose of lasix. Plan for additional dose in the am pending renal function. Will likely need higher dose of lasix at time of discharge home.  -- daily weights -- I&Os  2. Severe AS: planned for TAVR with continued work up as outpatient.   3. CKD Stage IV: Cr up to 2.4 today with diuresis. Hold evening dose of lasix. Resume in the morning pending renal function.  -- daily BMET  4. HTN: stable with current therapy  5. HL: on crestor  6. Pulmonary HTN  Signed, Reino Bellis, NP  09/05/2016, 10:24 AM

## 2016-09-06 ENCOUNTER — Ambulatory Visit (HOSPITAL_COMMUNITY): Payer: Medicare Other

## 2016-09-06 ENCOUNTER — Inpatient Hospital Stay (HOSPITAL_COMMUNITY): Payer: Medicare Other

## 2016-09-06 ENCOUNTER — Encounter (HOSPITAL_COMMUNITY): Payer: Self-pay | Admitting: General Practice

## 2016-09-06 DIAGNOSIS — I5043 Acute on chronic combined systolic (congestive) and diastolic (congestive) heart failure: Secondary | ICD-10-CM

## 2016-09-06 DIAGNOSIS — Z01810 Encounter for preprocedural cardiovascular examination: Secondary | ICD-10-CM

## 2016-09-06 LAB — VAS US CAROTID
LCCADDIAS: 13 cm/s
LEFT ECA DIAS: -2 cm/s
LEFT VERTEBRAL DIAS: 10 cm/s
LICAPDIAS: -17 cm/s
LICAPSYS: -96 cm/s
Left CCA dist sys: 118 cm/s
Left CCA prox dias: 13 cm/s
Left CCA prox sys: 111 cm/s
Left ICA dist dias: -15 cm/s
Left ICA dist sys: -86 cm/s
RIGHT ECA DIAS: 0 cm/s
RIGHT VERTEBRAL DIAS: 19 cm/s
Right CCA prox dias: 17 cm/s
Right CCA prox sys: 123 cm/s
Right cca dist sys: -132 cm/s

## 2016-09-06 LAB — BASIC METABOLIC PANEL
Anion gap: 9 (ref 5–15)
BUN: 40 mg/dL — ABNORMAL HIGH (ref 6–20)
CHLORIDE: 104 mmol/L (ref 101–111)
CO2: 25 mmol/L (ref 22–32)
Calcium: 8.4 mg/dL — ABNORMAL LOW (ref 8.9–10.3)
Creatinine, Ser: 2.72 mg/dL — ABNORMAL HIGH (ref 0.44–1.00)
GFR calc non Af Amer: 14 mL/min — ABNORMAL LOW (ref >60)
GFR, EST AFRICAN AMERICAN: 16 mL/min — AB (ref >60)
Glucose, Bld: 97 mg/dL (ref 65–99)
POTASSIUM: 4.7 mmol/L (ref 3.5–5.1)
Sodium: 138 mmol/L (ref 135–145)

## 2016-09-06 LAB — GLUCOSE, CAPILLARY
GLUCOSE-CAPILLARY: 100 mg/dL — AB (ref 65–99)
GLUCOSE-CAPILLARY: 110 mg/dL — AB (ref 65–99)
GLUCOSE-CAPILLARY: 106 mg/dL — AB (ref 65–99)
Glucose-Capillary: 127 mg/dL — ABNORMAL HIGH (ref 65–99)

## 2016-09-06 NOTE — Progress Notes (Signed)
Chaplain stopped in while rounding to visit with patient and family.  Chaplain introduced herself to the patient and the family.  Chaplain shared [place] with patient.  Family member is happy to have support and let me know they are Christians.      09/06/16 0924  Clinical Encounter Type  Visited With Patient;Family;Patient and family together  Visit Type Initial;Spiritual support;Social support   Chaplain available should this family or patient request another visit.

## 2016-09-06 NOTE — Plan of Care (Signed)
Problem: Education: Goal: Knowledge of [facility] Education information/materials will improve Outcome: Progressing Pt and family verbalize understanding of pt's plan of care, including monitoring labs, and diueresing with IV lasix.  Pt and family also aware of plan for CTA on Friday, 09/07/16.  Problem: Safety: Goal: Ability to remain free from injury will improve Outcome: Progressing Low bed ordered.  Pt calls staff for assistance out of bed.  Problem: Pain Managment: Goal: General experience of comfort will improve Outcome: Progressing Pt denies c/o pain or discomfort at this time.  Problem: Physical Regulation: Goal: Ability to maintain clinical measurements within normal limits will improve Outcome: Progressing VSS  Problem: Cardiovascular: Goal: Vascular access site(s) Level 0-1 will be maintained Outcome: Completed/Met Date Met: 09/06/16 Right radial and right brachial sites level 0.

## 2016-09-06 NOTE — Progress Notes (Signed)
*  PRELIMINARY RESULTS* Vascular Ultrasound Carotid Duplex (Doppler) has been completed.  Preliminary findings: Bilateral: No significant (1-39%) ICA stenosis. Antegrade vertebral flow.    Landry Mellow, RDMS, RVT  09/06/2016, 2:18 PM

## 2016-09-06 NOTE — Plan of Care (Signed)
Problem: Safety: Goal: Ability to remain free from injury will improve Outcome: Progressing Ambulates to BR with standby assist. Tolerates well.

## 2016-09-06 NOTE — Progress Notes (Signed)
Progress Note  Patient Name: Amber [name] Date of Encounter: 09/06/2016  Primary Cardiologist: Vyas/McAlhany  Subjective   Breathing much improved today.   Inpatient Medications    Scheduled Meds: . amLODipine  10 mg Oral Daily  . aspirin EC  81 mg Oral Daily  . cholecalciferol  400 Units Oral Daily  . docusate sodium  200 mg Oral Daily  . feeding supplement (GLUCERNA SHAKE)  237 mL Oral BID BM  . hydrALAZINE  25 mg Oral Q8H  . nebivolol  2.5 mg Oral Daily  . rosuvastatin  20 mg Oral Daily  . sodium chloride flush  3 mL Intravenous Q12H  . vitamin C  500 mg Oral Daily   Continuous Infusions: . sodium chloride     PRN Meds: sodium chloride, acetaminophen, albuterol, ondansetron (ZOFRAN) IV, sodium chloride flush   Vital Signs    Vitals:   09/05/16 1403 09/05/16 1955 09/05/16 2050 09/06/16 0618  BP: (!) 148/51 (!) 122/55 (!) 132/44 132/71  Pulse: (!) 56 (!) 54  (!) 52  Resp: 18 18  18   Temp: 98.3 F (36.8 C) 98.4 F (36.9 C)  98.5 F (36.9 C)  TempSrc: Oral Oral  Oral  SpO2: 97% 97%  98%  Weight:    170 lb 11.2 oz (77.4 kg)    Intake/Output Summary (Last 24 hours) at 09/06/16 0954 Last data filed at 09/05/16 1804  Gross per 24 hour  Intake              480 ml  Output                0 ml  Net              480 ml   Filed Weights   09/05/16 0509 09/05/16 1000 09/06/16 0618  Weight: 174 lb 9.7 oz (79.2 kg) 170 lb 8 oz (77.3 kg) 170 lb 11.2 oz (77.4 kg)    Telemetry    SR - Personally Reviewed  ECG    N/a - Personally Reviewed  Physical Exam   General: Well developed, well nourished, female appearing in no acute distress. Head: Normocephalic, atraumatic.  Neck: Supple without bruits, + JVD. Lungs:  Resp regular and unlabored, Diminished bilaterally. Heart: RRR, S1, S2, no S3, S4, 4/6 systolic murmur; no rub. Abdomen: Soft, non-tender, non-distended with normoactive bowel sounds. No hepatomegaly. No rebound/guarding. No obvious abdominal  masses. Extremities: No clubbing, cyanosis, no edema. Distal pedal pulses are 2+ bilaterally. Neuro: Alert and oriented X 3. Moves all extremities spontaneously. Psych: Normal affect.  Labs    Chemistry Recent Labs Lab 09/04/16 0159 09/05/16 0244 09/06/16 0221  NA 140 139 138  K 4.5 4.4 4.7  CL 105 105 104  CO2 26 25 25   GLUCOSE 91 87 97  BUN 27* 34* 40*  CREATININE 2.19* 2.40* 2.72*  CALCIUM 8.8* 8.6* 8.4*  GFRNONAA 18* 16* 14*  GFRAA 21* 19* 16*  ANIONGAP 9 9 9      Hematology Recent Labs Lab 08/31/16 1314 09/04/16 0159  WBC 7.0 6.7  RBC 3.83 3.58*  HGB 10.8* 10.1*  HCT 31.9* 32.5*  MCV 83 90.8  MCH 28.2 28.2  MCHC 33.9 31.1  RDW 14.5 15.3  PLT 181 147*    Cardiac EnzymesNo results for input(s): TROPONINI in the last 168 hours. No results for input(s): TROPIPOC in the last 168 hours.   BNPNo results for input(s): BNP, PROBNP in the last 168 hours.   DDimer No results for  input(s): DDIMER in the last 168 hours.    Radiology    No results found.  Cardiac Studies   R/LHC: 09/03/16  Conclusion     Mid RCA lesion, 30 %stenosed.  Mid LAD lesion, 20 %stenosed.  Mid LAD to Dist LAD lesion, 20 %stenosed.  There is severe aortic valve stenosis.  Hemodynamic findings consistent with severe pulmonary hypertension.  1. Mild non-obstructive CAD 2. Severe aortic valve stenosis (mean gradient 47.6 mmHg, peak to peak gradient 64 mmHg, AVA 1.0 cm2) 3. Severe pulmonary HTN 4. Elevated LV filling pressures.   Recommendations: I will admit her to telemetry for diuresis today given dyspnea, wheezing, LE edema and elevated filling pressures. She has severe pulm HTN as well. Will arrange chest x-ray today. Follow renal function closely post cath and with diuresis. Hold Ace-inh. Will start hydralazine and continue Norvasc. We will plan 24-48 hours of diuresis. Will continue TAVR workup as an outpatient. She will need staged CT scans due to renal insufficiency.     Patient Profile     83 y.o. female with history of diabetes mellitus, hypertension, hyperlipidemia, chronic kidney disease, chronic diastolic CHF and severe aortic stenosis with moderate aortic regurgitation who was admitted post cath for diuresis.   Assessment & Plan    1. Acute on Chronic diastolic HF: Volume overloaded with elevated pressures on cath 09/03/16. She was admitted with plans for diuresis. Not much UOP noted this admission, weight at 170lbs which is actually below her reported home weight of 174lb. Cr up 2.1>>2.4>>2.7 today. Continue to hold diuretic today. Encourage PO fluids. F/u Cr in the am.   2. Severe AS: planned for TAVR with continued work up as outpatient.   3. CKD Stage IV: Cr up to 2.7 today. Continuing to hold diuretic today.  -- daily BMET  4. HTN: stable with current therapy  5. HL: on crestor  6. Pulmonary HTN  Signed, Reino Bellis, NP  09/06/2016, 9:54 AM

## 2016-09-06 NOTE — Plan of Care (Signed)
Problem: Safety: Goal: Ability to remain free from injury will improve Outcome: Progressing Pt has family at bedside, uses bedside commode with 1 assist without difficulty.  Bed alarm in use, call light and personal items within reach.

## 2016-09-07 ENCOUNTER — Ambulatory Visit (HOSPITAL_COMMUNITY): Admission: RE | Admit: 2016-09-07 | Payer: Medicare Other | Source: Ambulatory Visit

## 2016-09-07 ENCOUNTER — Ambulatory Visit (HOSPITAL_COMMUNITY): Payer: Medicare Other

## 2016-09-07 ENCOUNTER — Inpatient Hospital Stay (HOSPITAL_COMMUNITY): Payer: Medicare Other

## 2016-09-07 LAB — PULMONARY FUNCTION TEST
DL/VA % PRED: 109 %
DL/VA: 5.51 ml/min/mmHg/L
DLCO COR % PRED: 19 %
DLCO COR: 5.37 ml/min/mmHg
DLCO UNC % PRED: 17 %
DLCO unc: 4.73 ml/min/mmHg
FEF 25-75 Post: 1.11 L/sec
FEF 25-75 Pre: 0.82 L/sec
FEF2575-%CHANGE-POST: 34 %
FEF2575-%PRED-POST: 116 %
FEF2575-%Pred-Pre: 86 %
FEV1-%Change-Post: 8 %
FEV1-%Pred-Post: 61 %
FEV1-%Pred-Pre: 56 %
FEV1-Post: 0.83 L
FEV1-Pre: 0.77 L
FEV1FVC-%Change-Post: 0 %
FEV1FVC-%PRED-PRE: 120 %
FEV6-%CHANGE-POST: 8 %
FEV6-%PRED-PRE: 53 %
FEV6-%Pred-Post: 57 %
FEV6-POST: 0.94 L
FEV6-PRE: 0.87 L
FEV6FVC-%Pred-Post: 105 %
FEV6FVC-%Pred-Pre: 105 %
FVC-%Change-Post: 10 %
FVC-%Pred-Post: 55 %
FVC-%Pred-Pre: 50 %
FVC-Post: 0.96 L
FVC-Pre: 0.87 L
POST FEV1/FVC RATIO: 87 %
PRE FEV6/FVC RATIO: 100 %
Post FEV6/FVC ratio: 100 %
Pre FEV1/FVC ratio: 88 %
RV % PRED: 111 %
RV: 3.07 L
TLC % pred: 73 %
TLC: 3.95 L

## 2016-09-07 LAB — BASIC METABOLIC PANEL
ANION GAP: 11 (ref 5–15)
Anion gap: 8 (ref 5–15)
BUN: 45 mg/dL — AB (ref 6–20)
BUN: 48 mg/dL — ABNORMAL HIGH (ref 6–20)
CHLORIDE: 104 mmol/L (ref 101–111)
CO2: 25 mmol/L (ref 22–32)
CO2: 22 mmol/L (ref 22–32)
CREATININE: 3.01 mg/dL — AB (ref 0.44–1.00)
Calcium: 8.7 mg/dL — ABNORMAL LOW (ref 8.9–10.3)
Calcium: 8.5 mg/dL — ABNORMAL LOW (ref 8.9–10.3)
Chloride: 102 mmol/L (ref 101–111)
Creatinine, Ser: 3.06 mg/dL — ABNORMAL HIGH (ref 0.44–1.00)
GFR calc Af Amer: 14 mL/min — ABNORMAL LOW (ref >60)
GFR calc non Af Amer: 12 mL/min — ABNORMAL LOW (ref >60)
GFR, EST AFRICAN AMERICAN: 14 mL/min — AB (ref >60)
GFR, EST NON AFRICAN AMERICAN: 12 mL/min — AB (ref >60)
Glucose, Bld: 96 mg/dL (ref 65–99)
Glucose, Bld: 131 mg/dL — ABNORMAL HIGH (ref 65–99)
POTASSIUM: 4.8 mmol/L (ref 3.5–5.1)
POTASSIUM: 5.1 mmol/L (ref 3.5–5.1)
SODIUM: 135 mmol/L (ref 135–145)
Sodium: 137 mmol/L (ref 135–145)

## 2016-09-07 LAB — GLUCOSE, CAPILLARY
GLUCOSE-CAPILLARY: 91 mg/dL (ref 65–99)
GLUCOSE-CAPILLARY: 176 mg/dL — AB (ref 65–99)
GLUCOSE-CAPILLARY: 140 mg/dL — AB (ref 65–99)
Glucose-Capillary: 177 mg/dL — ABNORMAL HIGH (ref 65–99)

## 2016-09-07 MED ORDER — SODIUM CHLORIDE 0.9 % IV SOLN
INTRAVENOUS | Status: DC
  Administered 2016-09-07 (×2): via INTRAVENOUS

## 2016-09-07 MED ORDER — ALBUTEROL SULFATE (2.5 MG/3ML) 0.083% IN NEBU
2.5000 mg | INHALATION_SOLUTION | Freq: Once | RESPIRATORY_TRACT | Status: AC
  Administered 2016-09-07: 2.5 mg via RESPIRATORY_TRACT

## 2016-09-07 NOTE — Progress Notes (Signed)
Progress Note  Patient Name: Tniyah Nakagawa Date of Encounter: 09/07/2016  Primary Cardiologist: Vyas/McAlhany  Subjective   Still breathing fine today. No complaints. Resting comfortably.  Inpatient Medications    Scheduled Meds: . amLODipine  10 mg Oral Daily  . aspirin EC  81 mg Oral Daily  . cholecalciferol  400 Units Oral Daily  . docusate sodium  200 mg Oral Daily  . feeding supplement (GLUCERNA SHAKE)  237 mL Oral BID BM  . hydrALAZINE  25 mg Oral Q8H  . nebivolol  2.5 mg Oral Daily  . rosuvastatin  20 mg Oral Daily  . sodium chloride flush  3 mL Intravenous Q12H  . vitamin C  500 mg Oral Daily   Continuous Infusions: . sodium chloride    . sodium chloride     PRN Meds: sodium chloride, acetaminophen, albuterol, ondansetron (ZOFRAN) IV, sodium chloride flush   Vital Signs    Vitals:   09/06/16 2100 09/07/16 0500 09/07/16 1035 09/07/16 1036  BP: (!) 131/39 (!) 158/45 (!) 148/43   Pulse: (!) 54 62 65   Resp: 18 18    Temp: 99 F (37.2 C) 98.6 F (37 C)    TempSrc: Oral Oral    SpO2: 94% 91% (!) 89% 95%  Weight:  171 lb 6.4 oz (77.7 kg)    Height:    5\' 6"  (1.676 m)    Intake/Output Summary (Last 24 hours) at 09/07/16 1306 Last data filed at 09/07/16 0600  Gross per 24 hour  Intake              480 ml  Output              600 ml  Net             -120 ml   Filed Weights   09/05/16 1000 09/06/16 0618 09/07/16 0500  Weight: 170 lb 8 oz (77.3 kg) 170 lb 11.2 oz (77.4 kg) 171 lb 6.4 oz (77.7 kg)    Telemetry    Maintaining normal sinus rhythm - Personally Reviewed  ECG    N/a - Personally Reviewed  Physical Exam   Physical Exam  Constitutional: She is oriented to person, place, and time. She appears well-nourished. No distress.  Very pleasant elderly woman who appears actually younger than her stated age.  HENT:  Head: Normocephalic and atraumatic.  Mouth/Throat: No oropharyngeal exudate.  Eyes: Conjunctivae and EOM are normal.  Neck:  Normal range of motion. Neck supple. No JVD present.  Radiated aortic stenosis murmur  Cardiovascular: Normal rate, regular rhythm and intact distal pulses.   Murmur heard.  Harsh crescendo-decrescendo mid to late systolic murmur is present with a grade of 4/6  at the upper right sternal border, upper left sternal border radiating to the neck Pulmonary/Chest: Effort normal and breath sounds normal. No respiratory distress. She has no wheezes. She has no rales.  Abdominal: Soft. Bowel sounds are normal. She exhibits no distension. There is no tenderness. There is no rebound.  Musculoskeletal: Normal range of motion. She exhibits no edema.  Neurological: She is alert and oriented to person, place, and time.  Skin: Skin is warm and dry. No erythema.  Psychiatric: She has a normal mood and affect. Her behavior is normal. Judgment and thought content normal.  Nursing note and vitals reviewed.   Labs    Chemistry  Recent Labs Lab 09/05/16 0244 09/06/16 0221 09/07/16 0339  NA 139 138 137  K 4.4 4.7 4.8  CL 105 104 104  CO2 25 25 25   GLUCOSE 87 97 96  BUN 34* 40* 45*  CREATININE 2.40* 2.72* 3.01*  CALCIUM 8.6* 8.4* 8.7*  GFRNONAA 16* 14* 12*  GFRAA 19* 16* 14*  ANIONGAP 9 9 8      Hematology  Recent Labs Lab 08/31/16 1314 09/04/16 0159  WBC 7.0 6.7  RBC 3.83 3.58*  HGB 10.8* 10.1*  HCT 31.9* 32.5*  MCV 83 90.8  MCH 28.2 28.2  MCHC 33.9 31.1  RDW 14.5 15.3  PLT 181 147*    Cardiac EnzymesNo results for input(s): TROPONINI in the last 168 hours. No results for input(s): TROPIPOC in the last 168 hours.   BNPNo results for input(s): BNP, PROBNP in the last 168 hours.   DDimer No results for input(s): DDIMER in the last 168 hours.    Radiology    No results found.  Cardiac Studies   R/LHC: 09/03/16  Conclusion     Mid RCA lesion, 30 %stenosed.  Mid LAD lesion, 20 %stenosed.  Mid LAD to Dist LAD lesion, 20 %stenosed.  There is severe aortic valve  stenosis.  Hemodynamic findings consistent with severe pulmonary hypertension.  1. Mild non-obstructive CAD 2. Severe aortic valve stenosis (mean gradient 47.6 mmHg, peak to peak gradient 64 mmHg, AVA 1.0 cm2) 3. Severe pulmonary HTN 4. Elevated LV filling pressures.   Recommendations: I will admit her to telemetry for diuresis today given dyspnea, wheezing, LE edema and elevated filling pressures. She has severe pulm HTN as well. Will arrange chest x-ray today. Follow renal function closely post cath and with diuresis. Hold Ace-inh. Will start hydralazine and continue Norvasc. We will plan 24-48 hours of diuresis. Will continue TAVR workup as an outpatient. She will need staged CT scans due to renal insufficiency.    Patient Profile     83 y.o. female with history of diabetes mellitus, hypertension, hyperlipidemia, chronic kidney disease, chronic diastolic CHF and severe aortic stenosis with moderate aortic regurgitation who was admitted post cath for diuresis.   Assessment & Plan    1. Acute on Chronic diastolic HF: Cardiac cath numbers revealed that she was volume overloaded with elevated right heart pressures. unfortunately, with diuresis, her creatinine levels have now gone up to 3.1. She appears to be pretty much euvolemic despite what the numbers show, and I'm reluctant to push any further diuresis. We actually held Lasix now as yesterday morning.  We will gently hydrate her with IV fluids  Volume overloaded with elevated pressures on cath 09/03/16. She was admitted with plans for diuresis. Not much UOP noted this admission, weight at 170lbs which is actually below her reported home weight of 174lb. Cr up 2.1>>2.4>>2.7 today. Continue to hold diuretic today. Encourage PO fluids. F/u Cr in the am.   2. Severe AS: continuing TAVR workup. She is having some of her evaluation done now.  She did have CT chest abdomen pelvis ordered for today that has been postponed. - We'll need to see  stable renal function prior to these being done.    3. CKD Stage IV:  Retin-A now up to 3.1. Continue to hold Lasix..  Gentle IV hydration on this afternoon with recheck of BP med at 1800. If it is trending in the right direction, think she can probably still be discharged home with plans to start oral diuretic Sunday   4. HTN: Relatively stable  5. HL:  On home dose Crestor  6. Pulmonary HTN - As per right heart  cath numbers. Based on the fact that she did not diurese well, this is probably all related to her valve.     Signed, Glenetta Hew, MD  09/07/2016, 1:06 PM

## 2016-09-07 NOTE — Progress Notes (Signed)
Patient is going off unit for a pulmonary function test. Will assess and medicate when she returns to the unit.

## 2016-09-07 NOTE — Care Management Important Message (Signed)
Important Message  Patient Details  Name: Amber [name] MRN: [id number] Date of Birth: 1921-09-29   Medicare Important Message Given:  Yes    Andoni Busch Montine Circle 09/07/2016, 2:46 PM

## 2016-09-08 ENCOUNTER — Inpatient Hospital Stay (HOSPITAL_COMMUNITY): Payer: Medicare Other

## 2016-09-08 LAB — BASIC METABOLIC PANEL
ANION GAP: 8 (ref 5–15)
ANION GAP: 9 (ref 5–15)
BUN: 51 mg/dL — ABNORMAL HIGH (ref 6–20)
BUN: 51 mg/dL — ABNORMAL HIGH (ref 6–20)
CO2: 24 mmol/L (ref 22–32)
CO2: 23 mmol/L (ref 22–32)
Calcium: 8.5 mg/dL — ABNORMAL LOW (ref 8.9–10.3)
Calcium: 8.7 mg/dL — ABNORMAL LOW (ref 8.9–10.3)
Chloride: 104 mmol/L (ref 101–111)
Chloride: 102 mmol/L (ref 101–111)
Creatinine, Ser: 3.12 mg/dL — ABNORMAL HIGH (ref 0.44–1.00)
Creatinine, Ser: 3.08 mg/dL — ABNORMAL HIGH (ref 0.44–1.00)
GFR calc Af Amer: 14 mL/min — ABNORMAL LOW (ref >60)
GFR calc non Af Amer: 12 mL/min — ABNORMAL LOW (ref >60)
GFR, EST AFRICAN AMERICAN: 14 mL/min — AB (ref >60)
GFR, EST NON AFRICAN AMERICAN: 12 mL/min — AB (ref >60)
Glucose, Bld: 99 mg/dL (ref 65–99)
Glucose, Bld: 106 mg/dL — ABNORMAL HIGH (ref 65–99)
POTASSIUM: 4.9 mmol/L (ref 3.5–5.1)
Potassium: 4.9 mmol/L (ref 3.5–5.1)
SODIUM: 136 mmol/L (ref 135–145)
SODIUM: 134 mmol/L — AB (ref 135–145)

## 2016-09-08 LAB — GLUCOSE, CAPILLARY
GLUCOSE-CAPILLARY: 100 mg/dL — AB (ref 65–99)
GLUCOSE-CAPILLARY: 192 mg/dL — AB (ref 65–99)
GLUCOSE-CAPILLARY: 171 mg/dL — AB (ref 65–99)
Glucose-Capillary: 120 mg/dL — ABNORMAL HIGH (ref 65–99)

## 2016-09-08 MED ORDER — FUROSEMIDE 10 MG/ML IJ SOLN
80.0000 mg | Freq: Once | INTRAMUSCULAR | Status: AC
  Administered 2016-09-08: 80 mg via INTRAVENOUS
  Filled 2016-09-08: qty 8

## 2016-09-08 NOTE — Plan of Care (Signed)
Problem: Activity: Goal: Capacity to carry out activities will improve Outcome: Progressing Ambulated in room with minimal SOB on exertion. Provided periods or rest  Problem: Cardiac: Goal: Ability to achieve and maintain adequate cardiopulmonary perfusion will improve Outcome: Progressing Decreased urinary output. Total 150 ml for shift

## 2016-09-08 NOTE — Progress Notes (Signed)
Pt daughter came up to nurse station requesting "need a doctor right now"...Upon entered room to assess pt, found lying in bed, in no acute respiratory distress. Emotionally distressed, daughter reports  "mother is spitting out"..." does not feel like self" ..."want to know what is happening to mother"... Adamant at talking to a physician at the moment. PA Almyra Deforest paged again and in to reassess Pt. Ordered received for EKG  And PCXR. Carried out. Son came later at his sister request. Patient and children noted distressed, in tears. Emotional comfort provided.

## 2016-09-08 NOTE — Progress Notes (Signed)
Pt C/o "hard to breathe". Stopped NS infusing at 100 ml/hr. Eulas Post, [place] paged and made aware. Will be in to assess.

## 2016-09-08 NOTE — Progress Notes (Signed)
IVF held discussed with HAO Hurley, PA. Recommended to keep on hold until tomorrow. Fluid not restarted. Patient and family updated.

## 2016-09-08 NOTE — Progress Notes (Signed)
Paged by nurse regarding SOB, on exam she has diminished breath sound in bilateral bases of lung, likely exacerbated by severe AS. He is not in acute on heart failure at this time.   Hilbert Corrigan PA Pager: [phone]

## 2016-09-08 NOTE — Progress Notes (Signed)
Report received in patient's room via [facility] RN using SBAR format, reviewed VS, eds, tests and patient's general condition, assumed care of patient.

## 2016-09-08 NOTE — Progress Notes (Signed)
Progress Note  Patient Name: Amber [name] Date of Encounter: 09/08/2016  Primary Cardiologist: Vyas/McAlhany  Subjective   Making less urine. Mild dyspnea and tightness in chest   Inpatient Medications    Scheduled Meds: . amLODipine  10 mg Oral Daily  . aspirin EC  81 mg Oral Daily  . cholecalciferol  400 Units Oral Daily  . docusate sodium  200 mg Oral Daily  . feeding supplement (GLUCERNA SHAKE)  237 mL Oral BID BM  . hydrALAZINE  25 mg Oral Q8H  . nebivolol  2.5 mg Oral Daily  . rosuvastatin  20 mg Oral Daily  . sodium chloride flush  3 mL Intravenous Q12H  . vitamin C  500 mg Oral Daily   Continuous Infusions: . sodium chloride    . sodium chloride 100 mL/hr at 09/07/16 2346   PRN Meds: sodium chloride, acetaminophen, albuterol, ondansetron (ZOFRAN) IV, sodium chloride flush   Vital Signs    Vitals:   09/07/16 1401 09/07/16 2058 09/07/16 2135 09/08/16 0544  BP: (!) 130/45 (!) 141/48 (!) 140/50 (!) 141/47  Pulse:  (!) 59    Resp:  16  18  Temp: 98.4 F (36.9 C) 98.5 F (36.9 C)  98.8 F (37.1 C)  TempSrc: Oral Oral  Oral  SpO2: 98% 95%  94%  Weight:    174 lb 9.6 oz (79.2 kg)  Height:        Intake/Output Summary (Last 24 hours) at 09/08/16 4259 Last data filed at 09/08/16 0600  Gross per 24 hour  Intake              830 ml  Output              150 ml  Net              680 ml   Filed Weights   09/06/16 0618 09/07/16 0500 09/08/16 0544  Weight: 170 lb 11.2 oz (77.4 kg) 171 lb 6.4 oz (77.7 kg) 174 lb 9.6 oz (79.2 kg)    Telemetry    Maintaining normal sinus rhythm - Personally Reviewed  ECG    N/a - Personally Reviewed  Physical Exam   Physical Exam  Constitutional: She is oriented to person, place, and time. She appears well-nourished. No distress.  Very pleasant elderly woman who appears actually younger than her stated age.  HENT:  Head: Normocephalic and atraumatic.  Mouth/Throat: No oropharyngeal exudate.  Eyes: Conjunctivae  and EOM are normal.  Neck: Normal range of motion. Neck supple. No JVD present.  Radiated aortic stenosis murmur  Cardiovascular: Normal rate, regular rhythm and intact distal pulses.   Murmur heard.  Harsh crescendo-decrescendo mid to late systolic murmur is present with a grade of 4/6  at the upper right sternal border, upper left sternal border radiating to the neck Pulmonary/Chest: Effort normal and breath sounds normal. No respiratory distress. She has no wheezes. She has no rales.  Abdominal: Soft. Bowel sounds are normal. She exhibits no distension. There is no tenderness. There is no rebound.  Musculoskeletal: Normal range of motion. She exhibits no edema.  Neurological: She is alert and oriented to person, place, and time.  Skin: Skin is warm and dry. No erythema.  Psychiatric: She has a normal mood and affect. Her behavior is normal. Judgment and thought content normal.  Nursing note and vitals reviewed.   Labs    Chemistry  Recent Labs Lab 09/07/16 0339 09/07/16 1921 09/08/16 0254  NA 137 135 136  K 4.8 5.1 4.9  CL 104 102 104  CO2 25 22 24   GLUCOSE 96 131* 99  BUN 45* 48* 51*  CREATININE 3.01* 3.06* 3.12*  CALCIUM 8.7* 8.5* 8.5*  GFRNONAA 12* 12* 12*  GFRAA 14* 14* 14*  ANIONGAP 8 11 8      Hematology  Recent Labs Lab 09/04/16 0159  WBC 6.7  RBC 3.58*  HGB 10.1*  HCT 32.5*  MCV 90.8  MCH 28.2  MCHC 31.1  RDW 15.3  PLT 147*    Cardiac EnzymesNo results for input(s): TROPONINI in the last 168 hours. No results for input(s): TROPIPOC in the last 168 hours.   BNPNo results for input(s): BNP, PROBNP in the last 168 hours.   DDimer No results for input(s): DDIMER in the last 168 hours.    Radiology    No results found.  Cardiac Studies   R/LHC: 09/03/16  Conclusion     Mid RCA lesion, 30 %stenosed.  Mid LAD lesion, 20 %stenosed.  Mid LAD to Dist LAD lesion, 20 %stenosed.  There is severe aortic valve stenosis.  Hemodynamic  findings consistent with severe pulmonary hypertension.  1. Mild non-obstructive CAD 2. Severe aortic valve stenosis (mean gradient 47.6 mmHg, peak to peak gradient 64 mmHg, AVA 1.0 cm2) 3. Severe pulmonary HTN 4. Elevated LV filling pressures.   Recommendations: I will admit her to telemetry for diuresis today given dyspnea, wheezing, LE edema and elevated filling pressures. She has severe pulm HTN as well. Will arrange chest x-ray today. Follow renal function closely post cath and with diuresis. Hold Ace-inh. Will start hydralazine and continue Norvasc. We will plan 24-48 hours of diuresis. Will continue TAVR workup as an outpatient. She will need staged CT scans due to renal insufficiency.    Patient Profile     83 y.o. female with history of diabetes mellitus, hypertension, hyperlipidemia, chronic kidney disease, chronic diastolic CHF and severe aortic stenosis with moderate aortic regurgitation who was admitted post cath for diuresis.   Assessment & Plan    1. Acute on Chronic diastolic HF: combination of AS/AR , age and renal failure post cath elevation in Cr Requiring hydration at this point Lungs clear    2. Severe AS: TAVR w/u on hold given renal failure unable to due CTA for sizing valve and peripheral access vessels    3. CKD Stage IV:  Baseline CR 2.1 now over 3.1 continue low dose saline hydration.   4. HTN: Relatively stable  5. HL:  On home dose Crestor     Signed, Jenkins Rouge, MD  09/08/2016, 8:38 AM

## 2016-09-08 NOTE — Progress Notes (Addendum)
Paged by nursing staff again for increasing SOB, still has diminished breath sound in bilateral basis. Continue to have CP, stat EKG unchanged with LBBB. Stat CXR ordered, discussed with Dr. Harrington Challenger, will order 80mg  IV lasix x 1. Unfortunately, this does not solve the underlying issue and we expect she will continue to deteriorate.   Hilbert Corrigan PA Pager: [phone]  Discussed with patient and her family, she still want chest compression, IV/ACLS meds, intubation and defibrillation if her heart stops.   Hilbert Corrigan PA Pager: [phone]

## 2016-09-09 DIAGNOSIS — I060 Rheumatic aortic stenosis: Secondary | ICD-10-CM

## 2016-09-09 LAB — URINALYSIS, ROUTINE W REFLEX MICROSCOPIC
Bacteria, UA: NONE SEEN
Bilirubin Urine: NEGATIVE
GLUCOSE, UA: NEGATIVE mg/dL
HGB URINE DIPSTICK: NEGATIVE
Ketones, ur: NEGATIVE mg/dL
NITRITE: NEGATIVE
Protein, ur: NEGATIVE mg/dL
SPECIFIC GRAVITY, URINE: 1.005 (ref 1.005–1.030)
pH: 5 (ref 5.0–8.0)

## 2016-09-09 LAB — GLUCOSE, CAPILLARY
GLUCOSE-CAPILLARY: 197 mg/dL — AB (ref 65–99)
Glucose-Capillary: 110 mg/dL — ABNORMAL HIGH (ref 65–99)
Glucose-Capillary: 181 mg/dL — ABNORMAL HIGH (ref 65–99)
Glucose-Capillary: 102 mg/dL — ABNORMAL HIGH (ref 65–99)

## 2016-09-09 LAB — SODIUM, URINE, RANDOM: SODIUM UR: 73 mmol/L

## 2016-09-09 LAB — CREATININE, URINE, RANDOM: Creatinine, Urine: 60.42 mg/dL

## 2016-09-09 MED ORDER — FAMOTIDINE 20 MG PO TABS
20.0000 mg | ORAL_TABLET | Freq: Every day | ORAL | Status: DC | PRN
  Administered 2016-09-09: 20 mg via ORAL
  Filled 2016-09-09: qty 1

## 2016-09-09 MED ORDER — FUROSEMIDE 10 MG/ML IJ SOLN
120.0000 mg | Freq: Two times a day (BID) | INTRAVENOUS | Status: DC
  Administered 2016-09-09 – 2016-09-12 (×7): 120 mg via INTRAVENOUS
  Filled 2016-09-09: qty 2
  Filled 2016-09-09: qty 12
  Filled 2016-09-09: qty 10
  Filled 2016-09-09: qty 12
  Filled 2016-09-09 (×3): qty 10
  Filled 2016-09-09: qty 12

## 2016-09-09 NOTE — Consult Note (Signed)
Renal Service Consult Note [facility]  Amber [name] 09/09/2016 Amber [name] D Requesting Physician:  Dr Angelena Form  Reason for Consult:  Acute / CRF HPI: The patient is a 83 y.o. year-old with hx of DM2, HTN and HL with 3 month hx of progressive DOE, one episode near syncope found to have severe aortic stenosis and referred to [facility] cardiology for consideration of TAVR procedure.  She underwent L/R heart cath on 7/23 which showed severe AS, pulm HTN and ^LVEDP mid 20's. Pt was admitted.  She received about 2 days of IV lasix, but creatinine started rising so lasix held.  On 7/27 creat ^'d to 3.0 and she was started on IVF's.  Creat leveled off around 3.0 w IVF"s, but today pt developed acute SOB.  Lasix was restarted at higher dose 120 mg IV q 12hrs and we are asked to see for A/ CRF.     Pt's baseline creatinine from May 2018 is 2.1.  Patient is on nasal cannula O2, no c/o's.  Tired.  No CP. +ankle swelling. UOP since admit is about 200- 600 recorded per day, except the 1st day was 1100.  Wt stable at 79kg.    ROS  denies CP  no joint pain   no HA  no blurry vision  no rash  no diarrhea  no nausea/ vomiting  no dysuria  no difficulty voiding  no change in urine color    Past Medical History  Past Medical History:  Diagnosis Date  . Aortic stenosis   . Chronic diastolic CHF (congestive heart failure) ([place])   . Diabetes mellitus without complication ([place])   . Elevated BUN 08/2016  . Elevated creatine kinase   . High cholesterol   . Hypertension    Past Surgical History  Past Surgical History:  Procedure Laterality Date  . ABDOMINAL HYSTERECTOMY    . RIGHT/LEFT HEART CATH AND CORONARY ANGIOGRAPHY N/A 09/03/2016   Procedure: Right/Left Heart Cath and Coronary Angiography;  Surgeon: Burnell Blanks, MD;  Location: [place] [facility];  Service: Cardiovascular;  Laterality: N/A;   Family History  Family History  Problem Relation Age of Onset  .  Diabetes Mother   . Cancer Father    Social History  reports that she has never smoked. She has never used smokeless tobacco. She reports that she does not drink alcohol or use drugs. Allergies  Allergies  Allergen Reactions  . Penicillins Rash   Home medications Prior to Admission medications   Medication Sig Start Date End Date Taking? Authorizing Provider  albuterol (PROVENTIL HFA;VENTOLIN HFA) 108 (90 Base) MCG/ACT inhaler Inhale 3 puffs into the lungs every 6 (six) hours as needed for wheezing or shortness of breath.   Yes [provider]  amLODipine-benazepril (LOTREL) 10-40 MG capsule Take 1 capsule by mouth daily.   Yes [provider]  aspirin EC 81 MG tablet Take 81 mg by mouth daily.   Yes [provider]  BIOTIN 5000 PO Take 5,000 mcg by mouth daily.   Yes [provider]  cholecalciferol (VITAMIN D) 400 units TABS tablet Take 400 Units by mouth daily.   Yes [provider]  furosemide (LASIX) 40 MG tablet Take 40 mg by mouth every other day.    Yes [provider]  meclizine (ANTIVERT) 25 MG tablet Take 0.5-1 tablets (12.5-25 mg total) by mouth 3 (three) times daily as needed for dizziness. 06/20/16  Yes Mesner, Corene Cornea, MD  nebivolol (BYSTOLIC) 5 MG tablet Take 2.5  mg by mouth daily.    Yes [provider]  rosuvastatin (CRESTOR) 20 MG tablet Take 20 mg by mouth daily.   Yes [provider]  vitamin C (ASCORBIC ACID) 500 MG tablet Take 500 mg by mouth daily.   Yes [provider]   Liver Function Tests No results for input(s): AST, ALT, ALKPHOS, BILITOT, PROT, ALBUMIN in the last 168 hours. No results for input(s): LIPASE, AMYLASE in the last 168 hours. CBC  Recent Labs Lab 09/04/16 0159  WBC 6.7  HGB 10.1*  HCT 32.5*  MCV 90.8  PLT 630*   Basic Metabolic Panel  Recent Labs Lab 09/04/16 0159 09/05/16 0244 09/06/16 0221 09/07/16 0339 09/07/16 1921 09/08/16 0254 09/08/16 0854  NA  140 139 138 137 135 136 134*  K 4.5 4.4 4.7 4.8 5.1 4.9 4.9  CL 105 105 104 104 102 104 102  CO2 '26 25 25 25 22 24 23  ' GLUCOSE 91 87 97 96 131* 99 106*  BUN 27* 34* 40* 45* 48* 51* 51*  CREATININE 2.19* 2.40* 2.72* 3.01* 3.06* 3.12* 3.08*  CALCIUM 8.8* 8.6* 8.4* 8.7* 8.5* 8.5* 8.7*   Iron/TIBC/Ferritin/ %Sat No results found for: IRON, TIBC, FERRITIN, IRONPCTSAT  Vitals:   09/08/16 2153 09/09/16 0358 09/09/16 0604 09/09/16 1413  BP: (!) 142/47 (!) 134/42 (!) 148/59 (!) 165/50  Pulse:  (!) 54  (!) 56  Resp:  16    Temp:  98.6 F (37 C)  98.4 F (36.9 C)  TempSrc:  Oral  Oral  SpO2:  98%  100%  Weight:  79.1 kg (174 lb 4.8 oz)    Height:       Exam Gen elderly AAF, tired appearing, no distress No rash, cyanosis or gangrene Sclera anicteric, throat clear  +JVD, transmitted M to neck Chest bilat basilar rales, no ^WOB RRR no MRG Abd soft ntnd no mass or ascites +bs GU deferred MS no joint effusions or deformity Ext 1+ bilat LE edema / no wounds or ulcers Neuro is alert, Ox 3 , nf  Na 134  k 4.9  CO2 23  BUN 51  Cr 3.08  eGFR 13 WBC 6k  HB 10 plt 147 CXR 7/29 +persistent moderately severe CHF/ effusions  Impression: 1. Acute on CKD 4 - baseline creat 2.1 from May 2018. AKI possibly due to contrast (7/23) vs hemodynamic/ cardiorenal.  Check UA, renal US. Agree with IV lasix for pulm edema.  Difficult situation, not sure if renal function will recover.  Not likely a good dialysis candidate.  Have d/w family. Will follow.   2  Severe AS 3  Pulm edema - combination vol and severe AS 4  HTN - on norvasc/ bystolic, BP's stable    Plan - as above  Kelly Splinter MD [employer] pager [phone]   09/09/2016, 4:45 PM

## 2016-09-09 NOTE — Progress Notes (Signed)
Progress Note  Patient Name: Amber [name] Date of Encounter: 09/09/2016  Primary Cardiologist: Vyas/McAlhany  Subjective   CHF yesterday with more dyspnea hydration held and lasix given Better this am no chest pain making urine   Inpatient Medications    Scheduled Meds: . amLODipine  10 mg Oral Daily  . aspirin EC  81 mg Oral Daily  . cholecalciferol  400 Units Oral Daily  . docusate sodium  200 mg Oral Daily  . feeding supplement (GLUCERNA SHAKE)  237 mL Oral BID BM  . hydrALAZINE  25 mg Oral Q8H  . nebivolol  2.5 mg Oral Daily  . rosuvastatin  20 mg Oral Daily  . sodium chloride flush  3 mL Intravenous Q12H  . vitamin C  500 mg Oral Daily   Continuous Infusions: . sodium chloride    . sodium chloride Stopped (09/08/16 1245)   PRN Meds: sodium chloride, acetaminophen, albuterol, ondansetron (ZOFRAN) IV, sodium chloride flush   Vital Signs    Vitals:   09/08/16 2031 09/08/16 2153 09/09/16 0358 09/09/16 0604  BP: (!) 138/45 (!) 142/47 (!) 134/42 (!) 148/59  Pulse: (!) 59  (!) 54   Resp: 19  16   Temp: 98.5 F (36.9 C)  98.6 F (37 C)   TempSrc: Oral  Oral   SpO2: 95%  98%   Weight:   174 lb 4.8 oz (79.1 kg)   Height:        Intake/Output Summary (Last 24 hours) at 09/09/16 0829 Last data filed at 09/09/16 0600  Gross per 24 hour  Intake             1185 ml  Output              450 ml  Net              735 ml   Filed Weights   09/07/16 0500 09/08/16 0544 09/09/16 0358  Weight: 171 lb 6.4 oz (77.7 kg) 174 lb 9.6 oz (79.2 kg) 174 lb 4.8 oz (79.1 kg)    Telemetry    Maintaining normal sinus rhythm - Personally Reviewed  ECG    N/a - Personally Reviewed  Physical Exam   Physical Exam  Constitutional: She is oriented to person, place, and time. She appears well-nourished. No distress.  Very pleasant elderly woman who appears actually younger than her stated age.  HENT:  Head: Normocephalic and atraumatic.  Mouth/Throat: No oropharyngeal  exudate.  Eyes: Conjunctivae and EOM are normal.  Neck: Normal range of motion. Neck supple. No JVD present.  Radiated aortic stenosis murmur  Cardiovascular: Normal rate, regular rhythm and intact distal pulses.   Murmur heard.  Harsh crescendo-decrescendo mid to late systolic murmur is present with a grade of 4/6  at the upper right sternal border, upper left sternal border radiating to the neck Pulmonary/Chest: Effort normal and breath sounds normal. No respiratory distress. She has no wheezes. She has no rales.  Abdominal: Soft. Bowel sounds are normal. She exhibits no distension. There is no tenderness. There is no rebound.  Musculoskeletal: Normal range of motion. She exhibits no edema.  Neurological: She is alert and oriented to person, place, and time.  Skin: Skin is warm and dry. No erythema.  Psychiatric: She has a normal mood and affect. Her behavior is normal. Judgment and thought content normal.  Nursing note and vitals reviewed.   Labs    Chemistry  Recent Labs Lab 09/07/16 1921 09/08/16 9169 09/08/16 4503  NA 135 136 134*  K 5.1 4.9 4.9  CL 102 104 102  CO2 22 24 23   GLUCOSE 131* 99 106*  BUN 48* 51* 51*  CREATININE 3.06* 3.12* 3.08*  CALCIUM 8.5* 8.5* 8.7*  GFRNONAA 12* 12* 12*  GFRAA 14* 14* 14*  ANIONGAP 11 8 9      Hematology  Recent Labs Lab 09/04/16 0159  WBC 6.7  RBC 3.58*  HGB 10.1*  HCT 32.5*  MCV 90.8  MCH 28.2  MCHC 31.1  RDW 15.3  PLT 147*    Cardiac EnzymesNo results for input(s): TROPONINI in the last 168 hours. No results for input(s): TROPIPOC in the last 168 hours.   BNPNo results for input(s): BNP, PROBNP in the last 168 hours.   DDimer No results for input(s): DDIMER in the last 168 hours.    Radiology    Dg Chest Port 1 View  Result Date: 09/08/2016 CLINICAL DATA:  Shortness of breath. History of diabetes and congestive heart failure. EXAM: PORTABLE CHEST 1 VIEW COMPARISON:  09/03/2016 and 07/10/2016. FINDINGS: 1643  hour. There is persistent pulmonary edema with bilateral pleural effusions and bibasilar atelectasis. Cardiomegaly and aortic atherosclerosis appear unchanged. No evidence of pneumothorax. The bones appear unchanged. Telemetry leads overlie the chest. IMPRESSION: No significant change from previous study. Persistent cardiomegaly, pulmonary edema and bilateral pleural effusions consistent with congestive heart failure. Electronically Signed   By: Richardean Sale M.D.   On: 09/08/2016 17:01    Cardiac Studies   R/LHC: 09/03/16  Conclusion     Mid RCA lesion, 30 %stenosed.  Mid LAD lesion, 20 %stenosed.  Mid LAD to Dist LAD lesion, 20 %stenosed.  There is severe aortic valve stenosis.  Hemodynamic findings consistent with severe pulmonary hypertension.  1. Mild non-obstructive CAD 2. Severe aortic valve stenosis (mean gradient 47.6 mmHg, peak to peak gradient 64 mmHg, AVA 1.0 cm2) 3. Severe pulmonary HTN 4. Elevated LV filling pressures.   Recommendations: I will admit her to telemetry for diuresis today given dyspnea, wheezing, LE edema and elevated filling pressures. She has severe pulm HTN as well. Will arrange chest x-ray today. Follow renal function closely post cath and with diuresis. Hold Ace-inh. Will start hydralazine and continue Norvasc. We will plan 24-48 hours of diuresis. Will continue TAVR workup as an outpatient. She will need staged CT scans due to renal insufficiency.    Patient Profile     83 y.o. female with history of diabetes mellitus, hypertension, hyperlipidemia, chronic kidney disease, chronic diastolic CHF and severe aortic stenosis with moderate aortic regurgitation who was admitted post cath for diuresis.   Assessment & Plan    1. Acute on Chronic diastolic HF: combination of AS/AR , age and renal failure post cath elevation in Cr Remaining over 3 will ask renal to see discussed with daughter no dialysis   2. Severe AS: TAVR w/u on hold given renal  failure unable to due CTA for sizing valve and peripheral access vessels    3. CKD Stage IV:  Baseline CR 2.1 now over 3.1 will give lasix 120 mg bid today as dyspnea and CHF making her symptomatic Renal to advise as well  4. HTN: Relatively stable  5. HL:  On home dose Crestor     Signed, Jenkins Rouge, MD  09/09/2016, 8:29 AM

## 2016-09-10 ENCOUNTER — Inpatient Hospital Stay (HOSPITAL_COMMUNITY): Payer: Medicare Other

## 2016-09-10 DIAGNOSIS — N179 Acute kidney failure, unspecified: Secondary | ICD-10-CM

## 2016-09-10 DIAGNOSIS — N184 Chronic kidney disease, stage 4 (severe): Secondary | ICD-10-CM

## 2016-09-10 LAB — BASIC METABOLIC PANEL
Anion gap: 8 (ref 5–15)
BUN: 58 mg/dL — ABNORMAL HIGH (ref 6–20)
CALCIUM: 8.6 mg/dL — AB (ref 8.9–10.3)
CHLORIDE: 100 mmol/L — AB (ref 101–111)
CO2: 24 mmol/L (ref 22–32)
CREATININE: 3.14 mg/dL — AB (ref 0.44–1.00)
GFR calc Af Amer: 14 mL/min — ABNORMAL LOW (ref >60)
GFR calc non Af Amer: 12 mL/min — ABNORMAL LOW (ref >60)
GLUCOSE: 100 mg/dL — AB (ref 65–99)
Potassium: 5 mmol/L (ref 3.5–5.1)
Sodium: 132 mmol/L — ABNORMAL LOW (ref 135–145)

## 2016-09-10 LAB — GLUCOSE, CAPILLARY
GLUCOSE-CAPILLARY: 120 mg/dL — AB (ref 65–99)
Glucose-Capillary: 102 mg/dL — ABNORMAL HIGH (ref 65–99)
Glucose-Capillary: 126 mg/dL — ABNORMAL HIGH (ref 65–99)
Glucose-Capillary: 96 mg/dL (ref 65–99)

## 2016-09-10 NOTE — Progress Notes (Signed)
Progress Note  Patient Name: Amber [name] Date of Encounter: 09/10/2016  Primary Cardiologist: Vyas/McAlhany   Subjective   She states breathing is  better today.  Inpatient Medications    Scheduled Meds: . amLODipine  10 mg Oral Daily  . aspirin EC  81 mg Oral Daily  . cholecalciferol  400 Units Oral Daily  . docusate sodium  200 mg Oral Daily  . feeding supplement (GLUCERNA SHAKE)  237 mL Oral BID BM  . hydrALAZINE  25 mg Oral Q8H  . nebivolol  2.5 mg Oral Daily  . rosuvastatin  20 mg Oral Daily  . sodium chloride flush  3 mL Intravenous Q12H  . vitamin C  500 mg Oral Daily   Continuous Infusions: . sodium chloride    . furosemide Stopped (09/09/16 1933)   PRN Meds: sodium chloride, acetaminophen, albuterol, famotidine, ondansetron (ZOFRAN) IV, sodium chloride flush   Vital Signs    Vitals:   09/09/16 2038 09/09/16 2149 09/10/16 0544 09/10/16 0647  BP: (!) 154/45 (!) 152/52 (!) 142/59 (!) 142/49  Pulse: (!) 59   (!) 59  Resp:      Temp: 98 F (36.7 C)   98.2 F (36.8 C)  TempSrc: Oral   Oral  SpO2: 99%   98%  Weight:    181 lb (82.1 kg)  Height:        Intake/Output Summary (Last 24 hours) at 09/10/16 0850 Last data filed at 09/10/16 3212  Gross per 24 hour  Intake               62 ml  Output             1500 ml  Net            -1438 ml   Filed Weights   09/08/16 0544 09/09/16 0358 09/10/16 0647  Weight: 174 lb 9.6 oz (79.2 kg) 174 lb 4.8 oz (79.1 kg) 181 lb (82.1 kg)    Telemetry    SB - Personally Reviewed  ECG    N/a - Personally Reviewed  Physical Exam   General: Well developed, well nourished, AA female wearing . Head: Normocephalic, atraumatic.  Neck: Supple without bruits, + JVD. Lungs:  Resp regular and unlabored, diminished bilaterally. Heart: RRR, S1, S2, no S3, S4, 4/6 systolic murmur; no rub. Abdomen: Soft, non-tender, non-distended with normoactive bowel sounds. No hepatomegaly. No rebound/guarding. No obvious  abdominal masses. Extremities: No clubbing, cyanosis, edema. Distal pedal pulses are 2+ bilaterally. Neuro: Alert and oriented X 3. Moves all extremities spontaneously. Psych: Normal affect.  Labs    Chemistry Recent Labs Lab 09/08/16 0254 09/08/16 0854 09/10/16 0251  NA 136 134* 132*  K 4.9 4.9 5.0  CL 104 102 100*  CO2 24 23 24   GLUCOSE 99 106* 100*  BUN 51* 51* 58*  CREATININE 3.12* 3.08* 3.14*  CALCIUM 8.5* 8.7* 8.6*  GFRNONAA 12* 12* 12*  GFRAA 14* 14* 14*  ANIONGAP 8 9 8      Hematology Recent Labs Lab 09/04/16 0159  WBC 6.7  RBC 3.58*  HGB 10.1*  HCT 32.5*  MCV 90.8  MCH 28.2  MCHC 31.1  RDW 15.3  PLT 147*    Cardiac EnzymesNo results for input(s): TROPONINI in the last 168 hours. No results for input(s): TROPIPOC in the last 168 hours.   BNPNo results for input(s): BNP, PROBNP in the last 168 hours.   DDimer No results for input(s): DDIMER in the last 168 hours.  Radiology    US Renal  Result Date: 09/10/2016 CLINICAL DATA:  83 year old hypertensive diabetic female with chronic kidney disease. Initial encounter. EXAM: RENAL / URINARY TRACT ULTRASOUND COMPLETE COMPARISON:  None. FINDINGS: Right Kidney: Length: 9.4 cm. Slightly echogenic without hydronephrosis. 1.2 and 1 cm cyst. Left Kidney: Length: 9.2 cm. Slightly echogenic without hydronephrosis. 1.9 and 2.2 cm cyst. Bladder: Appears normal for degree of bladder distention. Bilateral pleural effusions. IMPRESSION: Slight increased renal echogenicity bilaterally consistent with changes of chronic kidney disease. No hydronephrosis. Bilateral renal cysts. Bilateral pleural effusions. Electronically Signed   By: Genia Del M.D.   On: 09/10/2016 08:39   Dg Chest Port 1 View  Result Date: 09/08/2016 CLINICAL DATA:  Shortness of breath. History of diabetes and congestive heart failure. EXAM: PORTABLE CHEST 1 VIEW COMPARISON:  09/03/2016 and 07/10/2016. FINDINGS: 1643 hour. There is persistent pulmonary  edema with bilateral pleural effusions and bibasilar atelectasis. Cardiomegaly and aortic atherosclerosis appear unchanged. No evidence of pneumothorax. The bones appear unchanged. Telemetry leads overlie the chest. IMPRESSION: No significant change from previous study. Persistent cardiomegaly, pulmonary edema and bilateral pleural effusions consistent with congestive heart failure. Electronically Signed   By: Richardean Sale M.D.   On: 09/08/2016 17:01    Cardiac Studies   R/LHC: 09/03/16  Conclusion     Mid RCA lesion, 30 %stenosed.  Mid LAD lesion, 20 %stenosed.  Mid LAD to Dist LAD lesion, 20 %stenosed.  There is severe aortic valve stenosis.  Hemodynamic findings consistent with severe pulmonary hypertension.  1. Mild non-obstructive CAD 2. Severe aortic valve stenosis (mean gradient 47.6 mmHg, peak to peak gradient 64 mmHg, AVA 1.0 cm2) 3. Severe pulmonary HTN 4. Elevated LV filling pressures.   Recommendations: I will admit her to telemetry for diuresis today given dyspnea, wheezing, LE edema and elevated filling pressures. She has severe pulm HTN as well. Will arrange chest x-ray today. Follow renal function closely post cath and with diuresis. Hold Ace-inh. Will start hydralazine and continue Norvasc. We will plan 24-48 hours of diuresis. Will continue TAVR workup as an outpatient. She will need staged CT scans due to renal insufficiency.    Patient Profile     83 y.o. female with history of diabetes mellitus, hypertension, hyperlipidemia, chronic kidney disease, chronic diastolic CHF and severe aortic stenosis with moderate aortic regurgitationwho was admitted post cath for diuresis. Attempted to diuresis but rising Cr. Nephrology consulted.   Assessment & Plan    1. Acute on Chronic diastolic HF in the setting of AS: Admitted post cath for diuresis related to elevated pressures on cath. Some diuresis and improvement in respiratory status but Cr rose. Attempted  gentle IVFs but became volume overloaded. Nephrology consulted.  -- weight trending up, but good UOP 1.5L yesterday   2. Severe AS: TAVR work up is currently on hold. Unable to do CTA 2/2 Cr.   3. CKD stage IV: Baseline Cr 2.1, currently 3.14 today. Lasix was increased to 120mg  BID with nephrology consulted yesterday and following.   4. HTN: stable with current therapy  5. HL: on statin  Signed, Reino Bellis, NP  09/10/2016, 8:50 AM    I have examined the patient and reviewed assessment and plan and discussed with patient.  Agree with above as stated.  Hoping to see Cr decrease.  Appreciate nephrology input.  Fluid status appears well controlled.  She is breathing comfortably lying at a 30 degree angle. Not a dialysis candidate.    Larae Grooms

## 2016-09-10 NOTE — Progress Notes (Signed)
Subjective:  1500 UOP- no signif change to renal function- BP if anything is high Objective Vital signs in last 24 hours: Vitals:   09/09/16 2038 09/09/16 2149 09/10/16 0544 09/10/16 0647  BP: (!) 154/45 (!) 152/52 (!) 142/59 (!) 142/49  Pulse: (!) 59   (!) 59  Resp:      Temp: 98 F (36.7 C)   98.2 F (36.8 C)  TempSrc: Oral   Oral  SpO2: 99%   98%  Weight:    82.1 kg (181 lb)  Height:       Weight change: 3.038 kg (6 lb 11.2 oz)  Intake/Output Summary (Last 24 hours) at 09/10/16 7893 Last data filed at 09/10/16 0855  Gross per 24 hour  Intake               62 ml  Output             1700 ml  Net            -1638 ml    Assessment/ Plan: Pt is a 83 y.o. yo female with advanced CKD at baseline who was admitted on 09/03/2016 with consideration for TAVR- s/p cardiac cath   Assessment/Plan: 1. Renal- A on CRF after cardiac cath.  U/A bland and U/S did not show obstruction, so no obvious reversibility.  Supportive care- making urine but no significant change in renal function .  Not a candidate for chronic dialysis therapy I would say due to age.  GFR was just over 20, now 14 but not overly uremic.  Hoping that kidney function will plateau and return to baseline 2. HTN/vol- overloaded- on moderate lasix with good uop- no change - also on amlodipine, hydralazine and bystolic  3. Anemia- will check iron stores and replete as needed  4. Secondary hyperparathyroidism- check PTH - act as needed  5. Critical AS- would be hesitant to proceed with TAVR given events to date   Ruthanna Macchia A    Labs: Basic Metabolic Panel:  Recent Labs Lab 09/08/16 0254 09/08/16 0854 09/10/16 0251  NA 136 134* 132*  K 4.9 4.9 5.0  CL 104 102 100*  CO2 24 23 24   GLUCOSE 99 106* 100*  BUN 51* 51* 58*  CREATININE 3.12* 3.08* 3.14*  CALCIUM 8.5* 8.7* 8.6*   Liver Function Tests: No results for input(s): AST, ALT, ALKPHOS, BILITOT, PROT, ALBUMIN in the last 168 hours. No results for  input(s): LIPASE, AMYLASE in the last 168 hours. No results for input(s): AMMONIA in the last 168 hours. CBC:  Recent Labs Lab 09/04/16 0159  WBC 6.7  HGB 10.1*  HCT 32.5*  MCV 90.8  PLT 147*   Cardiac Enzymes: No results for input(s): CKTOTAL, CKMB, CKMBINDEX, TROPONINI in the last 168 hours. CBG:  Recent Labs Lab 09/09/16 0723 09/09/16 1124 09/09/16 1621 09/09/16 2044 09/10/16 0829  GLUCAP 110* 181* 102* 197* 102*    Iron Studies: No results for input(s): IRON, TIBC, TRANSFERRIN, FERRITIN in the last 72 hours. Studies/Results: US Renal  Result Date: 09/10/2016 CLINICAL DATA:  83 year old hypertensive diabetic female with chronic kidney disease. Initial encounter. EXAM: RENAL / URINARY TRACT ULTRASOUND COMPLETE COMPARISON:  None. FINDINGS: Right Kidney: Length: 9.4 cm. Slightly echogenic without hydronephrosis. 1.2 and 1 cm cyst. Left Kidney: Length: 9.2 cm. Slightly echogenic without hydronephrosis. 1.9 and 2.2 cm cyst. Bladder: Appears normal for degree of bladder distention. Bilateral pleural effusions. IMPRESSION: Slight increased renal echogenicity bilaterally consistent with changes of chronic kidney disease. No hydronephrosis. Bilateral  renal cysts. Bilateral pleural effusions. Electronically Signed   By: Genia Del M.D.   On: 09/10/2016 08:39   Dg Chest Port 1 View  Result Date: 09/08/2016 CLINICAL DATA:  Shortness of breath. History of diabetes and congestive heart failure. EXAM: PORTABLE CHEST 1 VIEW COMPARISON:  09/03/2016 and 07/10/2016. FINDINGS: 1643 hour. There is persistent pulmonary edema with bilateral pleural effusions and bibasilar atelectasis. Cardiomegaly and aortic atherosclerosis appear unchanged. No evidence of pneumothorax. The bones appear unchanged. Telemetry leads overlie the chest. IMPRESSION: No significant change from previous study. Persistent cardiomegaly, pulmonary edema and bilateral pleural effusions consistent with congestive heart failure.  Electronically Signed   By: Richardean Sale M.D.   On: 09/08/2016 17:01   Medications: Infusions: . sodium chloride    . furosemide Stopped (09/09/16 1933)    Scheduled Medications: . amLODipine  10 mg Oral Daily  . aspirin EC  81 mg Oral Daily  . cholecalciferol  400 Units Oral Daily  . docusate sodium  200 mg Oral Daily  . feeding supplement (GLUCERNA SHAKE)  237 mL Oral BID BM  . hydrALAZINE  25 mg Oral Q8H  . nebivolol  2.5 mg Oral Daily  . rosuvastatin  20 mg Oral Daily  . sodium chloride flush  3 mL Intravenous Q12H  . vitamin C  500 mg Oral Daily    have reviewed scheduled and prn medications.  Physical Exam: General: pleasant, appears younger than age Heart: RRR Lungs: mostly clear Abdomen: obese, soft Extremities: pitting edema     09/10/2016,9:52 AM  LOS: 6 days

## 2016-09-10 NOTE — Progress Notes (Signed)
Pt requested PRN neb tx for wheezing. Upon RT's arrival, pt states she feels better, no wheeze heard at this time. Advised pt if she feels she needs the tx later, to notify RN. RT will continue to monitor.

## 2016-09-11 ENCOUNTER — Encounter (HOSPITAL_COMMUNITY): Payer: Medicare Other

## 2016-09-11 ENCOUNTER — Inpatient Hospital Stay (HOSPITAL_COMMUNITY): Admission: RE | Admit: 2016-09-11 | Payer: Medicare Other | Source: Ambulatory Visit

## 2016-09-11 DIAGNOSIS — N184 Chronic kidney disease, stage 4 (severe): Secondary | ICD-10-CM

## 2016-09-11 DIAGNOSIS — N189 Chronic kidney disease, unspecified: Secondary | ICD-10-CM

## 2016-09-11 LAB — IRON AND TIBC
Iron: 33 ug/dL (ref 28–170)
SATURATION RATIOS: 14 % (ref 10.4–31.8)
TIBC: 232 ug/dL — AB (ref 250–450)
UIBC: 199 ug/dL

## 2016-09-11 LAB — RENAL FUNCTION PANEL
ALBUMIN: 3.2 g/dL — AB (ref 3.5–5.0)
Anion gap: 11 (ref 5–15)
BUN: 60 mg/dL — AB (ref 6–20)
CALCIUM: 8.7 mg/dL — AB (ref 8.9–10.3)
CO2: 26 mmol/L (ref 22–32)
Chloride: 99 mmol/L — ABNORMAL LOW (ref 101–111)
Creatinine, Ser: 3.06 mg/dL — ABNORMAL HIGH (ref 0.44–1.00)
GFR calc Af Amer: 14 mL/min — ABNORMAL LOW (ref >60)
GFR calc non Af Amer: 12 mL/min — ABNORMAL LOW (ref >60)
GLUCOSE: 89 mg/dL (ref 65–99)
Phosphorus: 5.1 mg/dL — ABNORMAL HIGH (ref 2.5–4.6)
Potassium: 5 mmol/L (ref 3.5–5.1)
SODIUM: 136 mmol/L (ref 135–145)

## 2016-09-11 LAB — GLUCOSE, CAPILLARY
GLUCOSE-CAPILLARY: 158 mg/dL — AB (ref 65–99)
Glucose-Capillary: 95 mg/dL (ref 65–99)
Glucose-Capillary: 115 mg/dL — ABNORMAL HIGH (ref 65–99)
Glucose-Capillary: 117 mg/dL — ABNORMAL HIGH (ref 65–99)

## 2016-09-11 LAB — FERRITIN: FERRITIN: 79 ng/mL (ref 11–307)

## 2016-09-11 NOTE — Progress Notes (Signed)
Subjective:  1750 UOP- no signif change to renal function- BP better Objective Vital signs in last 24 hours: Vitals:   09/10/16 2212 09/11/16 0629 09/11/16 0645 09/11/16 0759  BP: (!) 139/52 (!) 139/56  (!) 126/46  Pulse:    (!) 58  Resp:    16  Temp:    98.2 F (36.8 C)  TempSrc:    Oral  SpO2:    91%  Weight:   79.9 kg (176 lb 1.6 oz)   Height:       Weight change: -2.223 kg (-4 lb 14.4 oz)  Intake/Output Summary (Last 24 hours) at 09/11/16 8299 Last data filed at 09/11/16 0600  Gross per 24 hour  Intake               60 ml  Output             1752 ml  Net            -1692 ml    Assessment/ Plan: Pt is [name] with advanced CKD at baseline who was admitted on 09/03/2016 with consideration for TAVR- s/p cardiac cath   Assessment/Plan: 1. Renal- [name] on CRF after cardiac cath.  U/[name] bland and U/S did not show obstruction, so no obvious reversibility.  Supportive care- making urine but no significant change in renal function .  Not [name] candidate for chronic dialysis therapy I would say due to age.  GFR was just over 20, now 14 but not overly uremic.  Hoping that kidney function will plateau and return to baseline 2. HTN/vol- overloaded- on moderate lasix with good uop- no change - also on amlodipine, hydralazine and bystolic (low doses) 3. Anemia-  iron stores low, will  replete 4. Secondary hyperparathyroidism- check PTH - pending 5. Critical AS- would be hesitant to proceed with TAVR given events to date   Amber [name]    Labs: Basic Metabolic Panel:  Recent Labs Lab 09/08/16 0854 09/10/16 0251 09/11/16 0449  NA 134* 132* 136  K 4.9 5.0 5.0  CL 102 100* 99*  CO2 23 24 26   GLUCOSE 106* 100* 89  BUN 51* 58* 60*  CREATININE 3.08* 3.14* 3.06*  CALCIUM 8.7* 8.6* 8.7*  PHOS  --   --  5.1*   Liver Function Tests:  Recent Labs Lab 09/11/16 0449  ALBUMIN 3.2*   No results for input(s): LIPASE, AMYLASE in the last 168 hours. No results for input(s):  AMMONIA in the last 168 hours. CBC: No results for input(s): WBC, NEUTROABS, HGB, HCT, MCV, PLT in the last 168 hours. Cardiac Enzymes: No results for input(s): CKTOTAL, CKMB, CKMBINDEX, TROPONINI in the last 168 hours. CBG:  Recent Labs Lab 09/10/16 0829 09/10/16 1125 09/10/16 1601 09/10/16 2151 09/11/16 0737  GLUCAP 102* 126* 96 120* 95    Iron Studies:   Recent Labs  09/11/16 0449  IRON 33  TIBC 232*  FERRITIN 79   Studies/Results: US Renal  Result Date: 09/10/2016 CLINICAL DATA:  83 year old hypertensive diabetic female with chronic kidney disease. Initial encounter. EXAM: RENAL / URINARY TRACT ULTRASOUND COMPLETE COMPARISON:  None. FINDINGS: Right Kidney: Length: 9.4 cm. Slightly echogenic without hydronephrosis. 1.2 and 1 cm cyst. Left Kidney: Length: 9.2 cm. Slightly echogenic without hydronephrosis. 1.9 and 2.2 cm cyst. Bladder: Appears normal for degree of bladder distention. Bilateral pleural effusions. IMPRESSION: Slight increased renal echogenicity bilaterally consistent with changes of chronic kidney disease. No hydronephrosis. Bilateral renal cysts. Bilateral pleural effusions. Electronically Signed  By: Genia Del M.D.   On: 09/10/2016 08:39   Medications: Infusions: . sodium chloride    . furosemide Stopped (09/10/16 1900)    Scheduled Medications: . amLODipine  10 mg Oral Daily  . aspirin EC  81 mg Oral Daily  . cholecalciferol  400 Units Oral Daily  . docusate sodium  200 mg Oral Daily  . feeding supplement (GLUCERNA SHAKE)  237 mL Oral BID BM  . hydrALAZINE  25 mg Oral Q8H  . nebivolol  2.5 mg Oral Daily  . rosuvastatin  20 mg Oral Daily  . sodium chloride flush  3 mL Intravenous Q12H  . vitamin C  500 mg Oral Daily    have reviewed scheduled and prn medications.  Physical Exam: General: pleasant, appears younger than age Heart: RRR Lungs: mostly clear Abdomen: obese, soft Extremities: pitting edema     09/11/2016,8:24 AM  LOS: 7 days

## 2016-09-11 NOTE — Progress Notes (Signed)
Pt ambulated with walker 200 feet with supplemental 02 at 4 liters, 02 sat 91 while ambulating, 93 while at rest, denies SOB or weakness, gait steady.  Edward Qualia RN

## 2016-09-11 NOTE — Care Management Important Message (Signed)
Important Message  Patient Details  Name: Amber [name] MRN: [id number] Date of Birth: [birth year]   Medicare Important Message Given:  Yes    [name] May 09/11/2016, 9:47 AM

## 2016-09-11 NOTE — Progress Notes (Signed)
Updated report received in patient's room via Rey RN using [employer], reviewed VS, new orders and events of the day, assumed care of patient.

## 2016-09-11 NOTE — Care Management Note (Addendum)
Case Management Note  Patient Details  Name: Pricilla Moehle MRN: [id number] Date of Birth: February 02, 1922  Subjective/Objective:  Pt presented for CKD, CHF and  Severe Aortic Stenosis with  Moderate Regurgitation. Admitted post cath for IV diuresis. Continues on IV Lasix bid. Plan for TAVR workup question outpatient. Pt is from home with nephew. Nephew works in the [employer]- so he is not home at all times. Pt's daughter is at the bedside and is supportive.                  Action/Plan: CM will continue to monitor for disposition needs.   Expected Discharge Date:                  Expected Discharge Plan:  [place]  In-House Referral:  NA  Discharge planning Services  CM Consult  Post Acute Care Choice:   Home with Home Health, Durable Medical Equipment   Choice offered to:   Patient, Adult Children  DME Arranged:   Oxygen DME Agency:   [place] Arranged:   RN, PT HH Agency:   [facility]  Status of Service:  Completed  If discussed at [place] of Stay Meetings, dates discussed:    Additional Comments: [place] 09-13-16 Jacqlyn Krauss, RN, BSN [phone] Received a call from [place] for [facility]- unable to accept the insurance not in network. CM did call Tommi Rumps with [facility] and they are in network to provide services. Pt in need for 02 once stable. Family wants to use [facility]-Order placed and CM did call [facility] for DME 02 to be delivered to room prior to d/c. No further needs from CM at this time.     1234 09-12-16 Jacqlyn Krauss, RN,BSN [phone] CM did speak with pt and daughter in regards to disposition needs. Pt is agreeable to [facility]. Daughter states pt will benefit from HHRN/ PT for evaluation and treatment. CM did provide agency list and family chose [facility]. Referral made to Sarah with [facility] and they are in network with insurance. SOC to begin within 24-28 hours post d/c. CM to monitor for home 02 needs.   Bethena Roys, RN 09/11/2016, 4:30 PM

## 2016-09-11 NOTE — Progress Notes (Signed)
Progress Note  Patient Name: Amber [name] Date of Encounter: 09/11/2016  Primary Cardiologist: Vyas/McAlhany   Subjective   Sitting up in chair this morning. Remains on O2.   Inpatient Medications    Scheduled Meds: . amLODipine  10 mg Oral Daily  . aspirin EC  81 mg Oral Daily  . cholecalciferol  400 Units Oral Daily  . docusate sodium  200 mg Oral Daily  . feeding supplement (GLUCERNA SHAKE)  237 mL Oral BID BM  . hydrALAZINE  25 mg Oral Q8H  . nebivolol  2.5 mg Oral Daily  . rosuvastatin  20 mg Oral Daily  . sodium chloride flush  3 mL Intravenous Q12H  . vitamin C  500 mg Oral Daily   Continuous Infusions: . sodium chloride    . furosemide 120 mg (09/11/16 0920)   PRN Meds: sodium chloride, acetaminophen, albuterol, famotidine, ondansetron (ZOFRAN) IV, sodium chloride flush   Vital Signs    Vitals:   09/10/16 2212 09/11/16 0629 09/11/16 0645 09/11/16 0759  BP: (!) 139/52 (!) 139/56  (!) 126/46  Pulse:    (!) 58  Resp:    16  Temp:    98.2 F (36.8 C)  TempSrc:    Oral  SpO2:    91%  Weight:   176 lb 1.6 oz (79.9 kg)   Height:        Intake/Output Summary (Last 24 hours) at 09/11/16 1200 Last data filed at 09/11/16 5701  Gross per 24 hour  Intake              300 ml  Output             1602 ml  Net            -1302 ml   Filed Weights   09/09/16 0358 09/10/16 0647 09/11/16 0645  Weight: 174 lb 4.8 oz (79.1 kg) 181 lb (82.1 kg) 176 lb 1.6 oz (79.9 kg)    Telemetry    SB - Personally Reviewed  ECG    N/a - Personally Reviewed  Physical Exam   General: Well developed, well nourished, older AA female appearing in no acute distress. Head: Normocephalic, atraumatic.  Neck: Supple without bruits, mild JVD. Lungs:  Resp regular and unlabored, diminished bilaterally with mild expiratory wheeze. Heart: RRR, S1, S2, no S3, S4, 4/6 systolic murmur; no rub. Abdomen: Soft, non-tender, non-distended with normoactive bowel sounds. No hepatomegaly. No  rebound/guarding. No obvious abdominal masses. Extremities: No clubbing, cyanosis, edema. Distal pedal pulses are 2+ bilaterally. Neuro: Alert and oriented X 3. Moves all extremities spontaneously. Psych: Normal affect.  Labs    Chemistry Recent Labs Lab 09/08/16 0854 09/10/16 0251 09/11/16 0449  NA 134* 132* 136  K 4.9 5.0 5.0  CL 102 100* 99*  CO2 23 24 26   GLUCOSE 106* 100* 89  BUN 51* 58* 60*  CREATININE 3.08* 3.14* 3.06*  CALCIUM 8.7* 8.6* 8.7*  ALBUMIN  --   --  3.2*  GFRNONAA 12* 12* 12*  GFRAA 14* 14* 14*  ANIONGAP 9 8 11      HematologyNo results for input(s): WBC, RBC, HGB, HCT, MCV, MCH, MCHC, RDW, PLT in the last 168 hours.  Cardiac EnzymesNo results for input(s): TROPONINI in the last 168 hours. No results for input(s): TROPIPOC in the last 168 hours.   BNPNo results for input(s): BNP, PROBNP in the last 168 hours.   DDimer No results for input(s): DDIMER in the last 168 hours.  Radiology    US Renal  Result Date: 09/10/2016 CLINICAL DATA:  83 year old hypertensive diabetic female with chronic kidney disease. Initial encounter. EXAM: RENAL / URINARY TRACT ULTRASOUND COMPLETE COMPARISON:  None. FINDINGS: Right Kidney: Length: 9.4 cm. Slightly echogenic without hydronephrosis. 1.2 and 1 cm cyst. Left Kidney: Length: 9.2 cm. Slightly echogenic without hydronephrosis. 1.9 and 2.2 cm cyst. Bladder: Appears normal for degree of bladder distention. Bilateral pleural effusions. IMPRESSION: Slight increased renal echogenicity bilaterally consistent with changes of chronic kidney disease. No hydronephrosis. Bilateral renal cysts. Bilateral pleural effusions. Electronically Signed   By: Genia Del M.D.   On: 09/10/2016 08:39    Cardiac Studies   R/LHC: 09/03/16  Conclusion     Mid RCA lesion, 30 %stenosed.  Mid LAD lesion, 20 %stenosed.  Mid LAD to Dist LAD lesion, 20 %stenosed.  There is severe aortic valve stenosis.  Hemodynamic findings consistent  with severe pulmonary hypertension.  1. Mild non-obstructive CAD 2. Severe aortic valve stenosis (mean gradient 47.6 mmHg, peak to peak gradient 64 mmHg, AVA 1.0 cm2) 3. Severe pulmonary HTN 4. Elevated LV filling pressures.   Recommendations: I will admit her to telemetry for diuresis today given dyspnea, wheezing, LE edema and elevated filling pressures. She has severe pulm HTN as well. Will arrange chest x-ray today. Follow renal function closely post cath and with diuresis. Hold Ace-inh. Will start hydralazine and continue Norvasc. We will plan 24-48 hours of diuresis. Will continue TAVR workup as an outpatient. She will need staged CT scans due to renal insufficiency.    Patient Profile     83 y.o. female with history of diabetes mellitus, hypertension, hyperlipidemia, chronic kidney disease, chronic diastolic CHF and severe aortic stenosis with moderate aortic regurgitationwho was admitted post cath for diuresis. Attempted to diuresis but rising Cr. Nephrology consulted.   Assessment & Plan    1. Acute on Chronic diastolic HF in the setting of AS: Admitted post cath for diuresis related to elevated pressures on cath. Some diuresis and improvement in respiratory status but Cr rose. Attempted gentle IVFs but became volume overloaded. Currently diuresing with IV lasix 120mg  BID. Cr stable. Breathing seemed improved. Plan to ambulate today. Weaning O2. -- weight trending down, but good UOP 1.7L yesterday  2. Severe AS: TAVR work up is currently on hold. Unable to do CTA 2/2 Cr.   3. CKD stage IV: Baseline Cr 2.1, currently stable 3.06 today. -- follow daily BMET  4. HTN: stable with current therapy  5. HL: on statin  Signed, Reino Bellis, NP  09/11/2016, 12:00 PM    I have examined the patient and reviewed assessment and plan and discussed with patient.  Agree with above as stated. Loud systolic murmur. Renal function improving.  Awaiting better Cr to perform TAVR w/u for  severe AS.    Larae Grooms

## 2016-09-11 NOTE — Progress Notes (Signed)
Nutrition Follow-up  DOCUMENTATION CODES:   Not applicable  INTERVENTION:  - Continue Glucerna Shake BID. - Continue to encourage PO intakes of meals and supplements.  NUTRITION DIAGNOSIS:   Inadequate oral intake related to poor appetite as evidenced by per patient/family report. -ongoing  GOAL:   Patient will meet greater than or equal to 90% of their needs -unmet   MONITOR:   PO intake, Supplement acceptance  ASSESSMENT:   83 yo female with PMH of HTN, DM, HLD, CKD, CHF, aortic stenosis who was admitted on 7/23 with dyspnea and orthopnea. Admitted for diuresis.  7/31 Per review, pt consumed 320 kcal and 9 grams of protein for breakfast today; 0% of lunch and dinner 7/28; 806 kcal and 49 grams of protein on 7/26; 540 kcal and 20 grams of protein from breakfast and lunch on 7/25. Pt has been accepting Glucerna Shake ~75% of the time it is offered. Weight fluctuations since admission with current weight +0.7 kg compared to admission weight.   Medications reviewed; 400 units vitamin D/day, 200 mg Colace/day, 120 mg IV Lasix BID started 7/29, 500 mg ascorbic acid/day.  Labs reviewed; CBGs: 95 and 115 mg/dL, Cl: 99 mmol/L, BUN: 60 mg/dL, creatinine: 3.06 mg/dL, Phos: 5.1 mg/dL, GFR: 12 mL/min.     7/24 - Patient's family at bedside, report that she, "eats like a bird."  - Patient feels like she is eating okay, but family thinks she needs to eat more.  - Daughter requested Ensure supplements between meals.  - Nutrition-Focused physical exam completed.  - Findings are no fat depletion, no muscle depletion, and no edema.    Diet Order:  Diet heart healthy/carb modified Room service appropriate? Yes; Fluid consistency: Thin  Skin:  Reviewed, no issues  Last BM:  7/31  Height:   Ht Readings from Last 1 Encounters:  09/07/16 5\' 6"  (1.676 m)    Weight:   Wt Readings from Last 1 Encounters:  09/11/16 176 lb 1.6 oz (79.9 kg)    Ideal Body Weight:  59.1 kg  BMI:  Body  mass index is 28.42 kg/m.  Estimated Nutritional Needs:   Kcal:  1650-1850  Protein:  80-90 gm  Fluid:  1.8 L  EDUCATION NEEDS:   No education needs identified at this time    Jarome Matin, MS, RD, LDN, CNSC Inpatient Clinical Dietitian Pager # [phone] After hours/weekend pager # [phone]

## 2016-09-12 LAB — RENAL FUNCTION PANEL
Albumin: 3.2 g/dL — ABNORMAL LOW (ref 3.5–5.0)
Anion gap: 7 (ref 5–15)
BUN: 60 mg/dL — AB (ref 6–20)
CHLORIDE: 100 mmol/L — AB (ref 101–111)
CO2: 28 mmol/L (ref 22–32)
CREATININE: 2.87 mg/dL — AB (ref 0.44–1.00)
Calcium: 8.6 mg/dL — ABNORMAL LOW (ref 8.9–10.3)
GFR calc Af Amer: 15 mL/min — ABNORMAL LOW (ref >60)
GFR calc non Af Amer: 13 mL/min — ABNORMAL LOW (ref >60)
GLUCOSE: 92 mg/dL (ref 65–99)
Phosphorus: 5 mg/dL — ABNORMAL HIGH (ref 2.5–4.6)
Potassium: 4.8 mmol/L (ref 3.5–5.1)
SODIUM: 135 mmol/L (ref 135–145)

## 2016-09-12 LAB — CBC
HCT: 28.5 % — ABNORMAL LOW (ref 36.0–46.0)
Hemoglobin: 8.8 g/dL — ABNORMAL LOW (ref 12.0–15.0)
MCH: 27.8 pg (ref 26.0–34.0)
MCHC: 30.9 g/dL (ref 30.0–36.0)
MCV: 90.2 fL (ref 78.0–100.0)
Platelets: 140 10*3/uL — ABNORMAL LOW (ref 150–400)
RBC: 3.16 MIL/uL — ABNORMAL LOW (ref 3.87–5.11)
RDW: 14.9 % (ref 11.5–15.5)
WBC: 5.5 10*3/uL (ref 4.0–10.5)

## 2016-09-12 LAB — BASIC METABOLIC PANEL
Anion gap: 9 (ref 5–15)
BUN: 61 mg/dL — AB (ref 6–20)
CHLORIDE: 98 mmol/L — AB (ref 101–111)
CO2: 28 mmol/L (ref 22–32)
CREATININE: 2.98 mg/dL — AB (ref 0.44–1.00)
Calcium: 8.7 mg/dL — ABNORMAL LOW (ref 8.9–10.3)
GFR calc Af Amer: 15 mL/min — ABNORMAL LOW (ref >60)
GFR calc non Af Amer: 13 mL/min — ABNORMAL LOW (ref >60)
GLUCOSE: 90 mg/dL (ref 65–99)
Potassium: 4.9 mmol/L (ref 3.5–5.1)
Sodium: 135 mmol/L (ref 135–145)

## 2016-09-12 LAB — GLUCOSE, CAPILLARY
GLUCOSE-CAPILLARY: 100 mg/dL — AB (ref 65–99)
Glucose-Capillary: 87 mg/dL (ref 65–99)
Glucose-Capillary: 120 mg/dL — ABNORMAL HIGH (ref 65–99)
Glucose-Capillary: 160 mg/dL — ABNORMAL HIGH (ref 65–99)

## 2016-09-12 LAB — PARATHYROID HORMONE, INTACT (NO CA): PTH: 78 pg/mL — AB (ref 15–65)

## 2016-09-12 MED ORDER — FUROSEMIDE 40 MG PO TABS
40.0000 mg | ORAL_TABLET | ORAL | Status: DC
  Administered 2016-09-13: 40 mg via ORAL
  Filled 2016-09-12: qty 1

## 2016-09-12 MED ORDER — FUROSEMIDE 40 MG PO TABS: 40.0000 mg | ORAL_TABLET | Freq: Every day | ORAL | Status: DC

## 2016-09-12 MED ORDER — FERUMOXYTOL INJECTION 510 MG/17 ML
510.0000 mg | Freq: Once | INTRAVENOUS | Status: AC
  Administered 2016-09-12: 510 mg via INTRAVENOUS
  Filled 2016-09-12: qty 17

## 2016-09-12 MED ORDER — FUROSEMIDE 80 MG PO TABS
80.0000 mg | ORAL_TABLET | Freq: Every day | ORAL | Status: DC
  Administered 2016-09-13 – 2016-09-14 (×2): 80 mg via ORAL
  Filled 2016-09-12 (×2): qty 1

## 2016-09-12 NOTE — Plan of Care (Signed)
Problem: Safety: Goal: Ability to remain free from injury will improve Outcome: Progressing Pt free from injury. Bed alarm on. Family at bedside. Pt A/O X3.   Problem: Pain Managment: Goal: General experience of comfort will improve Outcome: Progressing Pt denies pain  Problem: Physical Regulation: Goal: Ability to maintain clinical measurements within normal limits will improve Outcome: Progressing Pt currently on 3L [place].Some SOB w/ exertion. Pt c/o occasional cough w/ mucus. Other VSS. Tolerating activity. Denies any Chest pain. Continue to monitor.

## 2016-09-12 NOTE — Progress Notes (Signed)
Subjective:  1650 UOP-  renal function maybe a little better ?  She looks good Objective Vital signs in last 24 hours: Vitals:   09/11/16 2147 09/12/16 0334 09/12/16 0644 09/12/16 1125  BP: (!) 150/55 (!) 148/56 (!) 152/51 (!) 156/54  Pulse:  (!) 56  (!) 55  Resp:  16  16  Temp:  98.6 F (37 C)  98.5 F (36.9 C)  TempSrc:  Oral  Oral  SpO2:  96%  100%  Weight:  78.9 kg (174 lb)    Height:       Weight change: -0.953 kg (-2 lb 1.6 oz)  Intake/Output Summary (Last 24 hours) at 09/12/16 1157 Last data filed at 09/12/16 0900  Gross per 24 hour  Intake              364 ml  Output             1300 ml  Net             -936 ml    Assessment/ Plan: Pt is a 83 y.o. yo female with advanced CKD at baseline who was admitted on 09/03/2016 with consideration for TAVR- s/p cardiac cath   Assessment/Plan: 1. Renal- A on CRF after cardiac cath.  U/A bland and U/S did not show obstruction, so no obvious reversibility.  Supportive care- making urine , maybe renal function better.  Not a candidate for chronic dialysis therapy I would say due to age.  GFR was just over 20, now 15 but not overly uremic.  Is looking good that function is better today 2. HTN/vol- overloaded- on moderate lasix with good uop- no change - also on amlodipine, hydralazine and bystolic (low doses).  Will change lasix to PO today , do 80 in AM and 40 in PM in preparation for discharge at some point 3. Anemia-  iron stores low, repleting 4. Secondary hyperparathyroidism- check PTH - pending 5. Critical AS- would be hesitant to proceed with TAVR given events to date   Lillieann Pavlich A    Labs: Basic Metabolic Panel:  Recent Labs Lab 09/10/16 0251 09/11/16 0449 09/12/16 0228  NA 132* 136 135  135  K 5.0 5.0 4.9  4.8  CL 100* 99* 98*  100*  CO2 24 26 28  28   GLUCOSE 100* 89 90  92  BUN 58* 60* 61*  60*  CREATININE 3.14* 3.06* 2.98*  2.87*  CALCIUM 8.6* 8.7* 8.7*  8.6*  PHOS  --  5.1* 5.0*   Liver  Function Tests:  Recent Labs Lab 09/11/16 0449 09/12/16 0228  ALBUMIN 3.2* 3.2*   No results for input(s): LIPASE, AMYLASE in the last 168 hours. No results for input(s): AMMONIA in the last 168 hours. CBC:  Recent Labs Lab 09/12/16 0228  WBC 5.5  HGB 8.8*  HCT 28.5*  MCV 90.2  PLT 140*   Cardiac Enzymes: No results for input(s): CKTOTAL, CKMB, CKMBINDEX, TROPONINI in the last 168 hours. CBG:  Recent Labs Lab 09/11/16 1148 09/11/16 1632 09/11/16 2016 09/12/16 0717 09/12/16 1122  GLUCAP 115* 117* 158* 87 100*    Iron Studies:   Recent Labs  09/11/16 0449  IRON 33  TIBC 232*  FERRITIN 79   Studies/Results: No results found. Medications: Infusions: . sodium chloride    . furosemide Stopped (09/12/16 9373)    Scheduled Medications: . amLODipine  10 mg Oral Daily  . aspirin EC  81 mg Oral Daily  . cholecalciferol  400 Units Oral Daily  .  docusate sodium  200 mg Oral Daily  . feeding supplement (GLUCERNA SHAKE)  237 mL Oral BID BM  . hydrALAZINE  25 mg Oral Q8H  . nebivolol  2.5 mg Oral Daily  . rosuvastatin  20 mg Oral Daily  . sodium chloride flush  3 mL Intravenous Q12H  . vitamin C  500 mg Oral Daily    have reviewed scheduled and prn medications.  Physical Exam: General: pleasant, appears younger than age Heart: RRR Lungs: mostly clear Abdomen: obese, soft Extremities: pitting edema, but better     09/12/2016,11:57 AM  LOS: 8 days

## 2016-09-12 NOTE — Progress Notes (Signed)
Progress Note  Patient Name: Amber [name] Date of Encounter: 09/12/2016  Primary Cardiologist: Vyas/McaAlhany  Subjective   Seems to be feeling better this morning.   Inpatient Medications    Scheduled Meds: . amLODipine  10 mg Oral Daily  . aspirin EC  81 mg Oral Daily  . cholecalciferol  400 Units Oral Daily  . docusate sodium  200 mg Oral Daily  . feeding supplement (GLUCERNA SHAKE)  237 mL Oral BID BM  . hydrALAZINE  25 mg Oral Q8H  . nebivolol  2.5 mg Oral Daily  . rosuvastatin  20 mg Oral Daily  . sodium chloride flush  3 mL Intravenous Q12H  . vitamin C  500 mg Oral Daily   Continuous Infusions: . sodium chloride    . furosemide 120 mg (09/12/16 0822)   PRN Meds: sodium chloride, acetaminophen, albuterol, famotidine, ondansetron (ZOFRAN) IV, sodium chloride flush   Vital Signs    Vitals:   09/11/16 2014 09/11/16 2147 09/12/16 0334 09/12/16 0644  BP: (!) 141/66 (!) 150/55 (!) 148/56 (!) 152/51  Pulse: (!) 54  (!) 56   Resp: 16  16   Temp: 98.5 F (36.9 C)  98.6 F (37 C)   TempSrc: Oral  Oral   SpO2: 96%  96%   Weight:   174 lb (78.9 kg)   Height:        Intake/Output Summary (Last 24 hours) at 09/12/16 1109 Last data filed at 09/12/16 0900  Gross per 24 hour  Intake              364 ml  Output             1300 ml  Net             -936 ml   Filed Weights   09/10/16 0647 09/11/16 0645 09/12/16 0334  Weight: 181 lb (82.1 kg) 176 lb 1.6 oz (79.9 kg) 174 lb (78.9 kg)    Telemetry    SB - Personally Reviewed  ECG    N/a - Personally Reviewed  Physical Exam   General: Well developed, well nourished, female appearing in no acute distress. Head: Normocephalic, atraumatic.  Neck: Supple without bruits, mild JVD. Lungs:  Resp regular and unlabored, CTA. Heart: RRR, S1, S2, no S3, S4, 4/6 systolic murmur; no rub. Abdomen: Soft, non-tender, non-distended with normoactive bowel sounds. No hepatomegaly. No rebound/guarding. No obvious abdominal  masses. Extremities: No clubbing, cyanosis, trace pretibial edema. Distal pedal pulses are 2+ bilaterally. Neuro: Alert and oriented X 3. Moves all extremities spontaneously. Psych: Normal affect.  Labs    Chemistry Recent Labs Lab 09/10/16 0251 09/11/16 0449 09/12/16 0228  NA 132* 136 135  135  K 5.0 5.0 4.9  4.8  CL 100* 99* 98*  100*  CO2 24 26 28  28   GLUCOSE 100* 89 90  92  BUN 58* 60* 61*  60*  CREATININE 3.14* 3.06* 2.98*  2.87*  CALCIUM 8.6* 8.7* 8.7*  8.6*  ALBUMIN  --  3.2* 3.2*  GFRNONAA 12* 12* 13*  13*  GFRAA 14* 14* 15*  15*  ANIONGAP 8 11 9  7      Hematology Recent Labs Lab 09/12/16 0228  WBC 5.5  RBC 3.16*  HGB 8.8*  HCT 28.5*  MCV 90.2  MCH 27.8  MCHC 30.9  RDW 14.9  PLT 140*    Cardiac EnzymesNo results for input(s): TROPONINI in the last 168 hours. No results for input(s): TROPIPOC in the  last 168 hours.   BNPNo results for input(s): BNP, PROBNP in the last 168 hours.   DDimer No results for input(s): DDIMER in the last 168 hours.    Radiology    No results found.  Cardiac Studies   R/LHC: 09/03/16  Conclusion     Mid RCA lesion, 30 %stenosed.  Mid LAD lesion, 20 %stenosed.  Mid LAD to Dist LAD lesion, 20 %stenosed.  There is severe aortic valve stenosis.  Hemodynamic findings consistent with severe pulmonary hypertension.  1. Mild non-obstructive CAD 2. Severe aortic valve stenosis (mean gradient 47.6 mmHg, peak to peak gradient 64 mmHg, AVA 1.0 cm2) 3. Severe pulmonary HTN 4. Elevated LV filling pressures.   Recommendations: I will admit her to telemetry for diuresis today given dyspnea, wheezing, LE edema and elevated filling pressures. She has severe pulm HTN as well.    Patient Profile     83 y.o. female with history of diabetes mellitus, hypertension, hyperlipidemia, chronic kidney disease, chronic diastolic CHF and severe aortic stenosis with moderate aortic regurgitationwho was admitted post  cath for diuresis. Attempted to diuresis but rising Cr. Nephrology consulted.  Assessment & Plan    1. Acute on Chronic diastolic HF in the setting of KA:[id number] post cath for diuresis related to elevated pressures on cath. Some diuresis and improvement in respiratory status but Cr rose. Attempted gentle IVFs but became volume overloaded. Currently diuresing with IV lasix 120mg  BID. Cr stable, and mildly improved this moring. Ambulated yesterday, still on O2. Ask for PT consult today. May need O2 at discharge.  -- weight trending down (174lbs), but good UOP 1.6L yesterday  2. Severe IO:MBTD work up is currently on hold. Unable to do CTA 2/2 Cr.   3. CKD stage HR:CBULAGTX Cr 2.1. Mildly improved 3.06>> 2.9 today. -- follow daily BMET  4. MIW:OEHOZY with current therapy  5. HL: on statin  6. Anemia: Hgb 8.8, no reports of bleeding -- follow CBC  Signed, Reino Bellis, NP  09/12/2016, 11:09 AM    I have examined the patient and reviewed assessment and plan and discussed with patient.  Agree with above as stated.  Renal function improving.  May need home oxygen.  WOuld start planning for discharge with oxygen as TAVR w/u is on hold at this time.    Larae Grooms

## 2016-09-13 ENCOUNTER — Inpatient Hospital Stay (HOSPITAL_COMMUNITY): Payer: Medicare Other

## 2016-09-13 LAB — RENAL FUNCTION PANEL
ALBUMIN: 3.1 g/dL — AB (ref 3.5–5.0)
ANION GAP: 7 (ref 5–15)
BUN: 61 mg/dL — AB (ref 6–20)
CHLORIDE: 102 mmol/L (ref 101–111)
CO2: 30 mmol/L (ref 22–32)
Calcium: 8.6 mg/dL — ABNORMAL LOW (ref 8.9–10.3)
Creatinine, Ser: 2.76 mg/dL — ABNORMAL HIGH (ref 0.44–1.00)
GFR, EST AFRICAN AMERICAN: 16 mL/min — AB (ref >60)
GFR, EST NON AFRICAN AMERICAN: 14 mL/min — AB (ref >60)
Glucose, Bld: 83 mg/dL (ref 65–99)
PHOSPHORUS: 4.7 mg/dL — AB (ref 2.5–4.6)
POTASSIUM: 4.6 mmol/L (ref 3.5–5.1)
Sodium: 139 mmol/L (ref 135–145)

## 2016-09-13 LAB — CBC
HEMATOCRIT: 26.3 % — AB (ref 36.0–46.0)
Hemoglobin: 8.4 g/dL — ABNORMAL LOW (ref 12.0–15.0)
MCH: 28.9 pg (ref 26.0–34.0)
MCHC: 31.9 g/dL (ref 30.0–36.0)
MCV: 90.4 fL (ref 78.0–100.0)
Platelets: 128 10*3/uL — ABNORMAL LOW (ref 150–400)
RBC: 2.91 MIL/uL — AB (ref 3.87–5.11)
RDW: 15 % (ref 11.5–15.5)
WBC: 5.3 10*3/uL (ref 4.0–10.5)

## 2016-09-13 LAB — GLUCOSE, CAPILLARY
GLUCOSE-CAPILLARY: 88 mg/dL (ref 65–99)
GLUCOSE-CAPILLARY: 138 mg/dL — AB (ref 65–99)
Glucose-Capillary: 176 mg/dL — ABNORMAL HIGH (ref 65–99)
Glucose-Capillary: 99 mg/dL (ref 65–99)

## 2016-09-13 MED ORDER — FUROSEMIDE 80 MG PO TABS: ORAL_TABLET | ORAL | 1 refills | Status: DC

## 2016-09-13 MED ORDER — ALUM & MAG HYDROXIDE-SIMETH 200-200-20 MG/5ML PO SUSP
15.0000 mL | Freq: Four times a day (QID) | ORAL | Status: DC | PRN
  Administered 2016-09-13: 15 mL via ORAL
  Filled 2016-09-13: qty 30

## 2016-09-13 MED ORDER — AMLODIPINE BESYLATE 10 MG PO TABS: 10.0000 mg | ORAL_TABLET | Freq: Every day | ORAL | 0 refills | Status: DC

## 2016-09-13 MED ORDER — HYDRALAZINE HCL 25 MG PO TABS: 25.0000 mg | ORAL_TABLET | Freq: Three times a day (TID) | ORAL | 1 refills | Status: DC

## 2016-09-13 NOTE — Progress Notes (Signed)
Pt c/o feeling chest tightness with inspiration. Lung sounds unchanged. Oxygen sats 90's on 2L. Notified Reino Bellis NP. Kizzie Ide ordered. Cont to monitor. Carroll Kinds RN

## 2016-09-13 NOTE — Progress Notes (Signed)
Progress Note  Patient Name: Amber [name] Date of Encounter: 09/13/2016  Primary Cardiologist: Vyas/McAlhany  Subjective   Feeling better this morning, was planned for discharge then began to feel short of breath again. Dc held today.   Inpatient Medications    Scheduled Meds: . amLODipine  10 mg Oral Daily  . aspirin EC  81 mg Oral Daily  . cholecalciferol  400 Units Oral Daily  . docusate sodium  200 mg Oral Daily  . feeding supplement (GLUCERNA SHAKE)  237 mL Oral BID BM  . furosemide  40 mg Oral Q24H  . furosemide  80 mg Oral Daily  . hydrALAZINE  25 mg Oral Q8H  . nebivolol  2.5 mg Oral Daily  . rosuvastatin  20 mg Oral Daily  . sodium chloride flush  3 mL Intravenous Q12H  . vitamin C  500 mg Oral Daily   Continuous Infusions: . sodium chloride     PRN Meds: sodium chloride, acetaminophen, albuterol, famotidine, ondansetron (ZOFRAN) IV, sodium chloride flush   Vital Signs    Vitals:   09/12/16 0644 09/12/16 1125 09/12/16 2015 09/13/16 0532  BP: (!) 152/51 (!) 156/54 (!) 166/55 (!) 166/47  Pulse:  (!) 55 60 60  Resp:  16 16 16   Temp:  98.5 F (36.9 C) 98 F (36.7 C) 98.3 F (36.8 C)  TempSrc:  Oral Oral Oral  SpO2:  100% 99% 94%  Weight:    170 lb 3.2 oz (77.2 kg)  Height:        Intake/Output Summary (Last 24 hours) at 09/13/16 1315 Last data filed at 09/13/16 0930  Gross per 24 hour  Intake              240 ml  Output             1825 ml  Net            -1585 ml   Filed Weights   09/11/16 0645 09/12/16 0334 09/13/16 0532  Weight: 176 lb 1.6 oz (79.9 kg) 174 lb (78.9 kg) 170 lb 3.2 oz (77.2 kg)    Telemetry    SB - Personally Reviewed  ECG    N/a- Personally Reviewed  Physical Exam   General: Frail older AA female appearing in no acute distress. Head: Normocephalic, atraumatic.  Neck: Supple without bruits, mild JVD. Lungs:  Resp regular and unlabored, CTA. Heart: RRR, S1, S2, no S3, S4, or 4/6 systolic murmur; no rub. Abdomen:  Soft, non-tender, non-distended with normoactive bowel sounds. No hepatomegaly. No rebound/guarding. No obvious abdominal masses. Extremities: No clubbing, cyanosis, edema. Distal pedal pulses are 2+ bilaterally. Neuro: Alert and oriented X 3. Moves all extremities spontaneously. Psych: Normal affect.  Labs    Chemistry Recent Labs Lab 09/11/16 0449 09/12/16 0228 09/13/16 0339  NA 136 135  135 139  K 5.0 4.9  4.8 4.6  CL 99* 98*  100* 102  CO2 26 28  28 30   GLUCOSE 89 90  92 83  BUN 60* 61*  60* 61*  CREATININE 3.06* 2.98*  2.87* 2.76*  CALCIUM 8.7* 8.7*  8.6* 8.6*  ALBUMIN 3.2* 3.2* 3.1*  GFRNONAA 12* 13*  13* 14*  GFRAA 14* 15*  15* 16*  ANIONGAP 11 9  7 7      Hematology Recent Labs Lab 09/12/16 0228 09/13/16 0339  WBC 5.5 5.3  RBC 3.16* 2.91*  HGB 8.8* 8.4*  HCT 28.5* 26.3*  MCV 90.2 90.4  MCH 27.8 28.9  MCHC 30.9 31.9  RDW 14.9 15.0  PLT 140* 128*    Cardiac EnzymesNo results for input(s): TROPONINI in the last 168 hours. No results for input(s): TROPIPOC in the last 168 hours.   BNPNo results for input(s): BNP, PROBNP in the last 168 hours.   DDimer No results for input(s): DDIMER in the last 168 hours.    Radiology    No results found.  Cardiac Studies   R/LHC: 09/03/16  Conclusion     Mid RCA lesion, 30 %stenosed.  Mid LAD lesion, 20 %stenosed.  Mid LAD to Dist LAD lesion, 20 %stenosed.  There is severe aortic valve stenosis.  Hemodynamic findings consistent with severe pulmonary hypertension.  1. Mild non-obstructive CAD 2. Severe aortic valve stenosis (mean gradient 47.6 mmHg, peak to peak gradient 64 mmHg, AVA 1.0 cm2) 3. Severe pulmonary HTN 4. Elevated LV filling pressures.   Recommendations: I will admit her to telemetry for diuresis today given dyspnea, wheezing, LE edema and elevated filling pressures. She has severe pulm HTN as well. Will arrange chest x-ray today. Follow renal function closely post cath and  with diuresis. Hold Ace-inh. Will start hydralazine and continue Norvasc. We will plan 24-48 hours of diuresis. Will continue TAVR workup as an outpatient. She will need staged CT scans due to renal insufficiency.     Patient Profile     83 y.o. female with history of diabetes mellitus, hypertension, hyperlipidemia, chronic kidney disease, chronic diastolic CHF and severe aortic stenosis with moderate aortic regurgitationwho was admitted post cath for diuresis. Attempted to diuresis but rising Cr. Nephrology consulted.  Assessment & Plan    1. Acute on Chronic diastolic HF in the setting of JO:ACZYSAYT post cath for diuresis related to elevated pressures on cath. Some diuresis and improvement in respiratory status but Cr rose. Attempted gentle IVFs but became volume overloaded. Currently diuresing with IV lasix 120mg  BID. Cr stable, and mildly improved this moring. Ambulated yesterday, still on O2. Worked with PT today, and recommending HHPT/RN.   -- weight trending down (170lbs), but good UOP 1.7L yesterday -- was feeling well this morning and planned for discharge, now feeling short of breath. No obvious signs of volume overload on exam.  -- will hold Dc today, check CXR  2. Severe KZ:SWFU work up is currently on hold. Unable to do CTA 2/2 Cr.   3. CKD stage XN:ATFTDDUK Cr 2.1. Mildly improved 3.06>> 2.7 today. -- follow daily BMET  4. GUR:KYHCWC with current therapy  5. HL: on statin  6. Anemia: Hgb 8.8, no reports of bleeding -- follow CBC  Signed, Reino Bellis, NP  09/13/2016, 1:15 PM     I have examined the patient and reviewed assessment and plan and discussed with patient.  Agree with above as stated.  Cr improved.  Mild SHOB.  Possible d/c tomorrow.  Will likely need oxygen at home.  TAVR w/u on hold for now.  Diuretic to maintain volume status.  Nephrology has been following.  Anemia likely related to renal disease.   Larae Grooms

## 2016-09-13 NOTE — Discharge Summary (Signed)
Discharge Summary    Patient ID: Amber [name],  MRN: [id number], DOB/AGE: [birth year] 81 y.o.  Admit date: 09/03/2016 Discharge date: 09/14/2016  Primary Care Provider: Jilda Panda Primary Cardiologist: Vyas/McAlhany (TAVR)  Discharge Diagnoses    Active Problems:   Severe aortic stenosis   Acute on chronic diastolic CHF (congestive heart failure) (HCC)   Aortic stenosis   CKD (chronic kidney disease)   Allergies Allergies  Allergen Reactions  . Penicillins Rash    Diagnostic Studies/Procedures    R/LHC: 09/03/16  Conclusion     Mid RCA lesion, 30 %stenosed.  Mid LAD lesion, 20 %stenosed.  Mid LAD to Dist LAD lesion, 20 %stenosed.  There is severe aortic valve stenosis.  Hemodynamic findings consistent with severe pulmonary hypertension.   1. Mild non-obstructive CAD 2. Severe aortic valve stenosis (mean gradient 47.6 mmHg, peak to peak gradient 64 mmHg, AVA 1.0 cm2) 3. Severe pulmonary HTN 4. Elevated LV filling pressures.   Recommendations: I will admit her to telemetry for diuresis today given dyspnea, wheezing, LE edema and elevated filling pressures. She has severe pulm HTN as well. Will arrange chest x-ray today. Follow renal function closely post cath and with diuresis. Hold Ace-inh. Will start hydralazine and continue Norvasc. We will plan 24-48 hours of diuresis. Will continue TAVR workup as an outpatient. She will need staged CT scans due to renal insufficiency.    _____________   History of Present Illness     83 yo female with history of diabetes mellitus, hypertension, hyperlipidemia, chronic kidney disease, chronic diastolic CHF and severe aortic stenosis with moderate aortic regurgitationwho is being admitted today post cath for diuresis. She was see in the office  By Dr. Angelena Form 2 weeks prior to cath as a new consult for evaluation of her aortic valve disease. She is followed in [facility] by Dr. Woody Seller who has referred her to our  office to discuss TAVR. She had progressive dyspnea with exertion for the last several months. She has been on Lasix for lower extremity edema with known stage 3 CKD. She also has dizziness with near syncope. Echocardiogram in the [facility] office march 13,2018 showed mildly dilated LV with concentric hypertrophy of the LV. There was grade 2 diastolic dysfunction. LVEF >65%. There was severe aortic valve stenosis with mean gradient of 47 mmHg, peak gradient of 76 mmHg, AVA 0.74 cm2. There was mild mitral regurgitation and moderate tricuspid regurgitation. Echo in our office 08/31/16 with severe AS with mean gradient of 88mg H, peak gradient of 155 mmHg and AVA of 0.61 cm2. There was moderate AI and TR. LV systolic function normal.   She has continued to have dyspnea and wheezing along with orthopnea. Cardiac cath 09/03/16 without obstructive CAD but her filling pressures are severely elevated. She was admitted for diuresis.   Hospital Course     Consultants: Nephrology  Attempted to diuresis with IV lasix but noted rise in Cr with little UOP. Lasix was stopped and gentle IVFs given. Then developed volume overload without improvement in Cr. Cr trend 2.19>>2.72>>3.06>> peaking at 3.14. Nephrology consulted and she was placed on 120mg  IV lasix with brisk diuresis. Net 3.9L, with weight trending down from 181-->170lbs. Cr began to trend down to 2.76. Remained O2 dependent throughout most of her admission, and unable to wean. Worked with PT who recommended HHPT as outpatient. Outpatient TAV work up was placed on hold given worsening renal function. She was transitioned to oral lasix 80mg  in the am and 40mg   in the pm. Will be sent on with home O2. Her home medications were changed to stop taking amlodipine-benazepril (Lotrel) and started amlodipine 10mg /hydralazine 25mg  TID. Was given instructions regarding daily weights.   She was seen by Dr. Irish Lack and determined stable for discharge home. Will  arrange follow up with primary cardiologist Dr. Woody Seller. Medication changes are listed below.   General: Frail older AA female appearing in no acute distress. Head: Normocephalic, atraumatic.  Neck: Supple without bruits, mild JVD. Lungs:  Resp regular and unlabored, CTA. Heart: RRR, S1, S2, no S3, S4, or 4/6 systolic murmur; no rub. Abdomen: Soft, non-tender, non-distended with normoactive bowel sounds. No hepatomegaly. No rebound/guarding. No obvious abdominal masses. Extremities: No clubbing, cyanosis, edema. Distal pedal pulses are 2+ bilaterally.  Neuro: Alert and oriented X 3. Moves all extremities spontaneously. Psych: Normal affect.  _____________  Discharge Vitals Blood pressure (!) 144/47, pulse (!) 57, temperature 98.4 F (36.9 C), temperature source Oral, resp. rate 17, height 5\' 6"  (1.676 m), weight 168 lb 1.6 oz (76.2 kg), SpO2 92 %.  Filed Weights   09/12/16 0334 09/13/16 0532 09/14/16 0637  Weight: 174 lb (78.9 kg) 170 lb 3.2 oz (77.2 kg) 168 lb 1.6 oz (76.2 kg)    Labs & Radiologic Studies    CBC  Recent Labs  09/12/16 0228 09/13/16 0339  WBC 5.5 5.3  HGB 8.8* 8.4*  HCT 28.5* 26.3*  MCV 90.2 90.4  PLT 140* 938*   Basic Metabolic Panel  Recent Labs  09/13/16 0339 09/14/16 0248  NA 139 138  K 4.6 4.5  CL 102 100*  CO2 30 30  GLUCOSE 83 81  BUN 61* 59*  CREATININE 2.76* 2.60*  CALCIUM 8.6* 8.7*  PHOS 4.7* 4.3   Liver Function Tests  Recent Labs  09/13/16 0339 09/14/16 0248  ALBUMIN 3.1* 3.1*   _____________  Dg Chest 2 View  Result Date: 09/13/2016 CLINICAL DATA:  83 year old female with a history of shortness of breath and cough. Current admission EXAM: CHEST  2 VIEW COMPARISON:  09/08/2016, 09/03/2016 FINDINGS: Cardiomediastinal silhouette unchanged with fullness in the central vasculature. Peribronchial thickening. Diffuse interlobular septal thickening. Thickening of the minor fissure. Dense opacities at the bilateral lung bases with  obscuration of the heart borders on the hemidiaphragms. No displaced fracture. Pleural effusion evident on the lateral view. IMPRESSION: Persisting edema with bilateral pleural effusions. Electronically Signed   By: Corrie Mckusick D.O.   On: 09/13/2016 15:25   Dg Chest 2 View  Result Date: 09/03/2016 CLINICAL DATA:  83 year old female with pleural effusion and shortness breath. Initial encounter. EXAM: CHEST  2 VIEW COMPARISON:  07/10/2016. FINDINGS: Worsening pulmonary edema and increasing pleural effusions greater on the right. Cardiomegaly. Calcified aorta. Thoracic kyphosis. IMPRESSION: Worsening pulmonary edema and increasing pleural effusions greater on the right. Cardiomegaly. Aortic Atherosclerosis (ICD10-I70.0). Electronically Signed   By: Genia Del M.D.   On: 09/03/2016 14:17   US Renal  Result Date: 09/10/2016 CLINICAL DATA:  83 year old hypertensive diabetic female with chronic kidney disease. Initial encounter. EXAM: RENAL / URINARY TRACT ULTRASOUND COMPLETE COMPARISON:  None. FINDINGS: Right Kidney: Length: 9.4 cm. Slightly echogenic without hydronephrosis. 1.2 and 1 cm cyst. Left Kidney: Length: 9.2 cm. Slightly echogenic without hydronephrosis. 1.9 and 2.2 cm cyst. Bladder: Appears normal for degree of bladder distention. Bilateral pleural effusions. IMPRESSION: Slight increased renal echogenicity bilaterally consistent with changes of chronic kidney disease. No hydronephrosis. Bilateral renal cysts. Bilateral pleural effusions. Electronically Signed   By: Genia Del  M.D.   On: 09/10/2016 08:39   Dg Chest Port 1 View  Result Date: 09/08/2016 CLINICAL DATA:  Shortness of breath. History of diabetes and congestive heart failure. EXAM: PORTABLE CHEST 1 VIEW COMPARISON:  09/03/2016 and 07/10/2016. FINDINGS: 1643 hour. There is persistent pulmonary edema with bilateral pleural effusions and bibasilar atelectasis. Cardiomegaly and aortic atherosclerosis appear unchanged. No evidence of  pneumothorax. The bones appear unchanged. Telemetry leads overlie the chest. IMPRESSION: No significant change from previous study. Persistent cardiomegaly, pulmonary edema and bilateral pleural effusions consistent with congestive heart failure. Electronically Signed   By: Richardean Sale M.D.   On: 09/08/2016 17:01   Disposition   Pt is being discharged home today in good condition.  Follow-up Plans & Appointments    Follow-up Information    Vyas, Sylvan Cheese, MD Follow up.   Specialty:  Cardiology Why:  Please follow up within the next 2 weeks with your heart doctor.  Contact information: [address] [place] [address] [phone]        Burnell Blanks, MD Follow up.   Specialty:  Cardiology Why:  The office will call you with a follow up appt. Contact information: [address] [place] [address] [phone]        Care, [facility] Follow up.   Specialty:  Home Health Services Why:  Registered Nurse and Physical Therapy for evaluation and treatment.  Contact information: [place] [address] [phone]        [facility], [facility] Follow up.   Why:  Oxygen Contact information: [address] [place] [address] [phone]          Discharge Instructions    (HEART FAILURE PATIENTS) Call MD:  Anytime you have any of the following symptoms: 1) 3 pound weight gain in 24 hours or 5 pounds in 1 week 2) shortness of breath, with or without a dry hacking cough 3) swelling in the hands, feet or stomach 4) if you have to sleep on extra pillows at night in order to breathe.    Complete by:  As directed    Diet - low sodium heart healthy    Complete by:  As directed    Diet - low sodium heart healthy    Complete by:  As directed    Discharge instructions    Complete by:  As directed    For patients with congestive heart failure, we give them these special  instructions:  1. Follow a low-salt diet and watch your fluid intake. In general, you should not be taking in more than 2 liters of fluid per day (no more than 8 glasses per day). Some patients are restricted to less than 1.5 liters of fluid per day (no more than 6 glasses per day). This includes sources of water in foods like soup, coffee, tea, milk, etc. 2. Weigh yourself on the same scale at same time of day and keep a log. 3. Call your doctor: (Anytime you feel any of the following symptoms)  - 3-4 pound weight gain in 1-2 days or 2 pounds overnight  - Shortness of breath, with or without a dry hacking cough  - Swelling in the hands, feet or stomach  - If you have to sleep on extra pillows at night in order to breathe   IT IS IMPORTANT TO LET YOUR DOCTOR KNOW EARLY ON IF YOU ARE HAVING SYMPTOMS SO WE CAN HELP  YOU!  Please arrange follow up with your primary cardiologist within the next 2 weeks to check your blood work. Dr. Camillia Herter office will call you with a follow up to discuss your heart valve.   Face-to-face encounter (required for Medicare/Medicaid patients)    Complete by:  As directed    I Reino Bellis certify that this patient is under my care and that I, or a nurse practitioner or physician's assistant working with me, had a face-to-face encounter that meets the physician face-to-face encounter requirements with this patient on 09/13/2016. The encounter with the patient was in whole, or in part for the following medical condition(s) which is the primary reason for home health care (List medical condition): Deconditioning, CHF   The encounter with the patient was in whole, or in part, for the following medical condition, which is the primary reason for home health care:  Deconditioning, CHF   I certify that, based on my findings, the following services are medically necessary home health services:   Nursing Physical therapy     Reason for Medically Necessary Home Health Services:   Skilled Nursing- Change/Decline in Patient Status   My clinical findings support the need for the above services:  Shortness of breath with activity   Further, I certify that my clinical findings support that this patient is homebound due to:  Shortness of Breath with activity   For home use only DME oxygen    Complete by:  As directed    Mode or (Route):  Nasal cannula   Liters per Minute:  3   Oxygen delivery system:  Gas   Home Health    Complete by:  As directed    To provide the following care/treatments:   PT RN     Increase activity slowly    Complete by:  As directed    Increase activity slowly    Complete by:  As directed      Discharge Medications   Current Discharge Medication List    START taking these medications   Details  amLODipine (NORVASC) 10 MG tablet Take 1 tablet (10 mg total) by mouth daily. Qty: 60 tablet, Refills: 0    hydrALAZINE (APRESOLINE) 25 MG tablet Take 1 tablet (25 mg total) by mouth every 8 (eight) hours. Qty: 90 tablet, Refills: 1      CONTINUE these medications which have CHANGED   Details  furosemide (LASIX) 80 MG tablet Take 80mg  (1 tablet) in the morning, and 40mg  (1/2 tablet in the evening) Qty: 60 tablet, Refills: 1      CONTINUE these medications which have NOT CHANGED   Details  albuterol (PROVENTIL HFA;VENTOLIN HFA) 108 (90 Base) MCG/ACT inhaler Inhale 3 puffs into the lungs every 6 (six) hours as needed for wheezing or shortness of breath.    aspirin EC 81 MG tablet Take 81 mg by mouth daily.    BIOTIN 5000 PO Take 5,000 mcg by mouth daily.    cholecalciferol (VITAMIN D) 400 units TABS tablet Take 400 Units by mouth daily.    meclizine (ANTIVERT) 25 MG tablet Take 0.5-1 tablets (12.5-25 mg total) by mouth 3 (three) times daily as needed for dizziness. Qty: 15 tablet, Refills: 0    nebivolol (BYSTOLIC) 5 MG tablet Take 2.5 mg by mouth daily.     rosuvastatin (CRESTOR) 20 MG tablet Take 20 mg by mouth daily.    vitamin  C (ASCORBIC ACID) 500 MG tablet Take 500 mg by mouth daily.  STOP taking these medications     amLODipine-benazepril (LOTREL) 10-40 MG capsule           Outstanding Labs/Studies   Will need BMET/CBC at office visit with primary cardiologist appt.   Duration of Discharge Encounter   Greater than 30 minutes including physician time.  Signed, Bhagat,Bhavinkumar, PA-C 09/14/2016, 11:10 AM  I have examined the patient and reviewed assessment and plan and discussed with patient.  Agree with above as stated.  Patient with 4/6 systolic murmur.  LE edema improved.  Plan for discharge ewith home O2.  COntiunue Lasix 80 mg in AM and 40 mg in PM ( around 2 PM).  Renal function has improved as well/  Will have her f/u with Dr. Angelena Form for TAVR, and her regular cardiologist, Dr. Woody Seller.  Amber [name]

## 2016-09-13 NOTE — Progress Notes (Signed)
SATURATION QUALIFICATIONS: (This note is used to comply with regulatory documentation for home oxygen)  Patient Saturations on Room Air at Rest = 91%  Patient Saturations on Room Air while Ambulating = 82%  Patient Saturations on 3 Liters of oxygen while Ambulating = 91%  Please briefly explain why patient needs home oxygen:

## 2016-09-13 NOTE — Care Management Important Message (Signed)
Important Message  Patient Details  Name: Amber [name] MRN: [id number] Date of Birth: 26-Dec-1921   Medicare Important Message Given:  Yes    [name] May 09/13/2016, 10:21 AM

## 2016-09-13 NOTE — Evaluation (Signed)
Physical Therapy Evaluation Patient Details Name: Amber [name] MRN: [id number] DOB: May 15, 1921 Today's Date: 09/13/2016     SATURATION QUALIFICATIONS: (This note is used to comply with regulatory documentation for home oxygen)  Patient Saturations on Room Air at Rest = 91%  Patient Saturations on Room Air while Ambulating = 83%  Patient Saturations on 2, 3 Liters of oxygen while Ambulating = 87%, 93%     History of Present Illness  83 yo female admitted with severe aortic stenosis, HF. Hx of DM, CKD, CHF, aortic stenosis  Clinical Impression  On eval, pt required Min assist for mobility. She walked ~120 feet x 2-once with a RW, once without a walker. Pt presents with general weakness, decreased activity tolerance, and impaired gait and balance. Recommend HHPT f/u.     Follow Up Recommendations Home health PT;Supervision - Intermittent    Equipment Recommendations  None recommended by PT    Recommendations for Other Services       Precautions / Restrictions Precautions Precautions: Fall Precaution Comments: monitor O2 sats Restrictions Weight Bearing Restrictions: No      Mobility  Bed Mobility               General bed mobility comments: sitting EOB  Transfers Overall transfer level: Needs assistance Equipment used: Rolling walker (2 wheeled);None Transfers: Sit to/from Stand Sit to Stand: Min guard         General transfer comment: close guard for safety.  x2  Ambulation/Gait Ambulation/Gait assistance: Min assist Ambulation Distance (Feet): 120 Feet (x2) Assistive device: None;Rolling walker (2 wheeled) Gait Pattern/deviations: Step-through pattern;Decreased stride length;Staggering left;Staggering right;Drifts right/left     General Gait Details: Walked x 1 without a RW-assist needed to stabilize throughout ambulation distance. Walked x1 with a RW-intermittent assist to stabilize.   Stairs            Wheelchair Mobility    Modified  Rankin (Stroke Patients Only)       Balance Overall balance assessment: Needs assistance           Standing balance-Leahy Scale: Fair                               Pertinent Vitals/Pain Pain Assessment: No/denies pain    Home Living Family/patient expects to be discharged to:: Private residence Living Arrangements: Other relatives (nephew) Available Help at Discharge: Family Type of Home: House Home Access: Stairs to enter   Technical brewer of Steps: 3 Home Layout: One level Home Equipment: Environmental consultant - 2 wheels;Cane - single point      Prior Function Level of Independence: Independent with assistive device(s)         Comments: uses assistive devices PRN.     Hand Dominance        Extremity/Trunk Assessment   Upper Extremity Assessment Upper Extremity Assessment: Generalized weakness    Lower Extremity Assessment Lower Extremity Assessment: Generalized weakness    Cervical / Trunk Assessment Cervical / Trunk Assessment: Normal  Communication   Communication: HOH  Cognition Arousal/Alertness: Awake/alert Behavior During Therapy: WFL for tasks assessed/performed Overall Cognitive Status: Within Functional Limits for tasks assessed                                        General Comments      Exercises     Assessment/Plan  PT Assessment Patient needs continued PT services  PT Problem List Decreased mobility;Decreased activity tolerance;Decreased balance;Decreased strength       PT Treatment Interventions DME instruction;Gait training;Therapeutic activities;Therapeutic exercise;Patient/family education;Balance training;Functional mobility training    PT Goals (Current goals can be found in the Care Plan section)  Acute Rehab PT Goals Patient Stated Goal: none stated PT Goal Formulation: With patient Time For Goal Achievement: 09/27/16 Potential to Achieve Goals: Good    Frequency Min 3X/week   Barriers  to discharge        Co-evaluation               AM-PAC PT "6 Clicks" Daily Activity  Outcome Measure Difficulty turning over in bed (including adjusting bedclothes, sheets and blankets)?: A Little Difficulty moving from lying on back to sitting on the side of the bed? : A Little Difficulty sitting down on and standing up from a chair with arms (e.g., wheelchair, bedside commode, etc,.)?: A Little Help needed moving to and from a bed to chair (including a wheelchair)?: A Little Help needed walking in hospital room?: A Little Help needed climbing 3-5 steps with a railing? : A Little 6 Click Score: 18    End of Session Equipment Utilized During Treatment: Gait belt Activity Tolerance: Patient tolerated treatment well Patient left: in chair;with call bell/phone within reach;with family/visitor present;with nursing/sitter in room   PT Visit Diagnosis: Muscle weakness (generalized) (M62.81);Difficulty in walking, not elsewhere classified (R26.2)    Time: 1624-4695 PT Time Calculation (min) (ACUTE ONLY): 17 min   Charges:   PT Evaluation $PT Eval Low Complexity: 1 Low     PT G Codes:          Weston Anna, MPT Pager: [phone]

## 2016-09-13 NOTE — Progress Notes (Signed)
Subjective:  1725 UOP-  renal function maybe a little better again -  She looks good Objective Vital signs in last 24 hours: Vitals:   09/12/16 0644 09/12/16 1125 09/12/16 2015 09/13/16 0532  BP: (!) 152/51 (!) 156/54 (!) 166/55 (!) 166/47  Pulse:  (!) 55 60 60  Resp:  16 16 16   Temp:  98.5 F (36.9 C) 98 F (36.7 C) 98.3 F (36.8 C)  TempSrc:  Oral Oral Oral  SpO2:  100% 99% 94%  Weight:    77.2 kg (170 lb 3.2 oz)  Height:       Weight change: -1.724 kg (-3 lb 12.8 oz)  Intake/Output Summary (Last 24 hours) at 09/13/16 0755 Last data filed at 09/13/16 0533  Gross per 24 hour  Intake              720 ml  Output             1725 ml  Net            -1005 ml    Assessment/ Plan: Pt is a 83 y.o. yo female with advanced CKD at baseline who was admitted on 09/03/2016 with consideration for TAVR- s/p cardiac cath   Assessment/Plan: 1. Renal- A on CRF after cardiac cath.  U/A bland and U/S did not show obstruction, so no obvious reversibility.  Supportive care- making urine , maybe renal function better.  Not a candidate for chronic dialysis therapy I would say due to age.  GFR was just over 20, now 15 but not overly uremic.  Is looking good that function is better again today.  Renal will sign off, I agree with efforts to discharge pt, focus on QOL right not for this nice lady.  Call with questions.  She does not need nephrology specific follow up, can follow with Amber [name] 2. HTN/vol- overloaded- on moderate lasix with good uop-  - also on amlodipine, hydralazine and bystolic (low doses).  Changed lasix to PO  , do 80 in AM and 40 in PM in preparation for discharge at some point 3. Anemia-  iron stores low, repleting 4. Secondary hyperparathyroidism- pth 70, calc/phos also OK 5. Critical AS- would be hesitant to proceed with TAVR given events to date   Amber [name] A    Labs: Basic Metabolic Panel:  Recent Labs Lab 09/11/16 0449 09/12/16 0228 09/13/16 0339  NA 136 135   135 139  K 5.0 4.9  4.8 4.6  CL 99* 98*  100* 102  CO2 26 28  28 30   GLUCOSE 89 90  92 83  BUN 60* 61*  60* 61*  CREATININE 3.06* 2.98*  2.87* 2.76*  CALCIUM 8.7* 8.7*  8.6* 8.6*  PHOS 5.1* 5.0* 4.7*   Liver Function Tests:  Recent Labs Lab 09/11/16 0449 09/12/16 0228 09/13/16 0339  ALBUMIN 3.2* 3.2* 3.1*   No results for input(s): LIPASE, AMYLASE in the last 168 hours. No results for input(s): AMMONIA in the last 168 hours. CBC:  Recent Labs Lab 09/12/16 0228 09/13/16 0339  WBC 5.5 5.3  HGB 8.8* 8.4*  HCT 28.5* 26.3*  MCV 90.2 90.4  PLT 140* 128*   Cardiac Enzymes: No results for input(s): CKTOTAL, CKMB, CKMBINDEX, TROPONINI in the last 168 hours. CBG:  Recent Labs Lab 09/12/16 0717 09/12/16 1122 09/12/16 1702 09/12/16 2019 09/13/16 0725  GLUCAP 87 100* 120* 160* 88    Iron Studies:   Recent Labs  09/11/16 0449  IRON 33  TIBC 232*  FERRITIN 79   Studies/Results: No results found. Medications: Infusions: . sodium chloride      Scheduled Medications: . amLODipine  10 mg Oral Daily  . aspirin EC  81 mg Oral Daily  . cholecalciferol  400 Units Oral Daily  . docusate sodium  200 mg Oral Daily  . feeding supplement (GLUCERNA SHAKE)  237 mL Oral BID BM  . furosemide  40 mg Oral Q24H  . furosemide  80 mg Oral Daily  . hydrALAZINE  25 mg Oral Q8H  . nebivolol  2.5 mg Oral Daily  . rosuvastatin  20 mg Oral Daily  . sodium chloride flush  3 mL Intravenous Q12H  . vitamin C  500 mg Oral Daily    have reviewed scheduled and prn medications.  Physical Exam: General: pleasant, appears younger than age Heart: RRR Lungs: mostly clear Abdomen: obese, soft Extremities: pitting edema, but better     09/13/2016,7:55 AM  LOS: 9 days

## 2016-09-13 NOTE — Plan of Care (Signed)
Problem: Safety: Goal: Ability to remain free from injury will improve Outcome: Progressing Pt free from injury. Family at bedside. A/O X3. Assisted to [facility]. Walked halls using walker.   Problem: Pain Managment: Goal: General experience of comfort will improve Outcome: Progressing Pt denies any pain.  Problem: Physical Regulation: Goal: Ability to maintain clinical measurements within normal limits will improve Outcome: Progressing Pt currently on 2.5-3L [place]. Denies any SOB or Chest pain. Swelling improved in LE. Pt reports gradual improvement. Other VSS. Continue to monitor.

## 2016-09-13 NOTE — Discharge Instructions (Signed)
Wear 2L of oxygen continuously and when walking turn oxygen to 3L

## 2016-09-14 ENCOUNTER — Ambulatory Visit (HOSPITAL_COMMUNITY): Payer: Medicare Other

## 2016-09-14 ENCOUNTER — Other Ambulatory Visit (HOSPITAL_COMMUNITY): Payer: Medicare Other

## 2016-09-14 LAB — RENAL FUNCTION PANEL
ALBUMIN: 3.1 g/dL — AB (ref 3.5–5.0)
Anion gap: 8 (ref 5–15)
BUN: 59 mg/dL — AB (ref 6–20)
CALCIUM: 8.7 mg/dL — AB (ref 8.9–10.3)
CO2: 30 mmol/L (ref 22–32)
CREATININE: 2.6 mg/dL — AB (ref 0.44–1.00)
Chloride: 100 mmol/L — ABNORMAL LOW (ref 101–111)
GFR calc Af Amer: 17 mL/min — ABNORMAL LOW (ref >60)
GFR calc non Af Amer: 15 mL/min — ABNORMAL LOW (ref >60)
Glucose, Bld: 81 mg/dL (ref 65–99)
PHOSPHORUS: 4.3 mg/dL (ref 2.5–4.6)
Potassium: 4.5 mmol/L (ref 3.5–5.1)
Sodium: 138 mmol/L (ref 135–145)

## 2016-09-14 LAB — GLUCOSE, CAPILLARY
Glucose-Capillary: 94 mg/dL (ref 65–99)
Glucose-Capillary: 133 mg/dL — ABNORMAL HIGH (ref 65–99)
Glucose-Capillary: 119 mg/dL — ABNORMAL HIGH (ref 65–99)

## 2016-09-14 NOTE — Progress Notes (Signed)
Discharge instructions given. Pt verbalized understanding and all questions were answered.  

## 2016-09-14 NOTE — Progress Notes (Signed)
Notified Butch Penny, clinical liaison [facility] that O2 will need to be delivered to room today for transport home.

## 2016-09-14 NOTE — Plan of Care (Signed)
Problem: Physical Regulation: Goal: Ability to maintain clinical measurements within normal limits will improve Outcome: Progressing Pt denies any SOB or dyspnea overnight. Remained on 3L [place]. VSS throughout shift. Denies any pain. Family at bedside.

## 2016-09-19 ENCOUNTER — Encounter: Payer: Medicare Other | Admitting: Surgery

## 2016-09-24 ENCOUNTER — Encounter (HOSPITAL_COMMUNITY): Payer: Medicare Other

## 2016-09-26 ENCOUNTER — Ambulatory Visit (INDEPENDENT_AMBULATORY_CARE_PROVIDER_SITE_OTHER): Payer: Medicare Other | Admitting: Cardiovascular Disease

## 2016-09-26 ENCOUNTER — Encounter: Payer: Self-pay | Admitting: Cardiovascular Disease

## 2016-09-26 VITALS — Ht 66.0 in | Wt 150.0 lb

## 2016-09-26 DIAGNOSIS — I350 Nonrheumatic aortic (valve) stenosis: Secondary | ICD-10-CM

## 2016-09-26 NOTE — Progress Notes (Signed)
Chief Complaint  Patient presents with  . Follow-up    severe aortic valve stenosis    History of Present Illness: 83 yo female with history of diabetes mellitus, hypertension, hyperlipidemia, chronic kidney disease, severe aortic stenosis and moderate aortic regurgitation who is here today for cardiac follow up. She is followed in [facility] by Dr. Woody Seller who referred her to our office to discuss TAVR. I met her on 08/22/16. She described progressive dyspnea with exertion for several months. She has been on Lasix for lower extremity edema. She also has dizziness with near syncope. Echocardiogram in the [facility] office march 13,2018 showed mildly dilated LV with concentric hypertrophy of the LV. There was grade 2 diastolic dysfunction. LVEF >65%. There was severe aortic valve stenosis with mean gradient of 47 mmHg, peak gradient of 76 mmHg, AVA 0.74 cm2. There is mild mitral regurgitation and moderate tricuspid regurgitation. Echo in our office July 2018 showed moderate LVH, normal LV systolic function and severe AS with mean gradient of 88 mmHg and peak gradient of 155 mmHg, moderate AI and trivial MR. She was interested in pursuing TAVR so cardiac cath was arranged on 09/03/16. She had mild non-obstructive CAD. By cath, her mean gradient across the aortic valve was 47.6 mmHg and her peak gradient was 64 mmHg). She was dyspneic and wheezing on her cath day with evidence of volume overload and was admitted to our inpatient service. LVEDP 35 mmHg by cath with mean PA pressure of 48 mmHg. With diuresis, she had acute on chronic renal failure with peak creatinine of 3.14. She was seen by Nephrology and was not felt to be a candidate for long term dialysis. Per discussions with Nephrology, Her risk of of contrast induced renal failure is high and invasive procedures such as TAVR would not be recommended. She was dependent upon supplemental oxygen despite diuresis (net negative 3.9 liters of  fluid) and was discharged home on supplemental oxygen therapy.   She is here today for follow up. She is feeling much better since discharge. She has lost 24 lbs over the last month. She has no LE edema. She denies chest pain, dyspnea, palpitations, orthopnea, PND, dizziness, near syncope or syncope. She is wearing supplemental O2 that was started in the hospital.    Primary Care Physician: Jilda Panda, MD Primary Cardiologist: Dr. Vear Clock [facility])   Past Medical History:  Diagnosis Date  . Aortic stenosis   . Chronic diastolic CHF (congestive heart failure) ([place])   . Diabetes mellitus without complication ([place])   . Elevated BUN 08/2016  . Elevated creatine kinase   . High cholesterol   . Hypertension     Past Surgical History:  Procedure Laterality Date  . ABDOMINAL HYSTERECTOMY    . RIGHT/LEFT HEART CATH AND CORONARY ANGIOGRAPHY N/A 09/03/2016   Procedure: Right/Left Heart Cath and Coronary Angiography;  Surgeon: Burnell Blanks, MD;  Location: [facility];  Service: Cardiovascular;  Laterality: N/A;    Current Outpatient Prescriptions  Medication Sig Dispense Refill  . albuterol (PROVENTIL HFA;VENTOLIN HFA) 108 (90 Base) MCG/ACT inhaler Inhale 3 puffs into the lungs every 6 (six) hours as needed for wheezing or shortness of breath.    Marland Kitchen amLODipine (NORVASC) 10 MG tablet Take 1 tablet (10 mg total) by mouth daily. 60 tablet 0  . aspirin EC 81 MG tablet Take 81 mg by mouth daily.    Marland Kitchen BIOTIN 5000 PO Take 5,000 mcg by mouth daily.    Marland Kitchen  cholecalciferol (VITAMIN D) 400 units TABS tablet Take 400 Units by mouth daily.    . furosemide (LASIX) 80 MG tablet Take 88m (1 tablet) in the morning, and 442m(1/2 tablet in the evening) 60 tablet 1  . hydrALAZINE (APRESOLINE) 25 MG tablet Take 1 tablet (25 mg total) by mouth every 8 (eight) hours. 90 tablet 1  . meclizine (ANTIVERT) 25 MG tablet Take 0.5-1 tablets (12.5-25 mg total) by mouth 3 (three) times  daily as needed for dizziness. 15 tablet 0  . nebivolol (BYSTOLIC) 5 MG tablet Take 2.5 mg by mouth daily.     . rosuvastatin (CRESTOR) 20 MG tablet Take 20 mg by mouth daily.    . vitamin C (ASCORBIC ACID) 500 MG tablet Take 500 mg by mouth daily.     No current facility-administered medications for this visit.     Allergies  Allergen Reactions  . Penicillins Rash    Social History   Social History  . Marital status: Single    Spouse name: N/A  . Number of children: 2  . Years of education: N/A   Occupational History  . [employer]y [employer], school cafeteria-retired    Social History Main Topics  . Smoking status: Never Smoker  . Smokeless tobacco: Never Used  . Alcohol use No  . Drug use: No  . Sexual activity: Not on file   Other Topics Concern  . Not on file   Social History Narrative  . No narrative on file    Family History  Problem Relation Age of Onset  . Diabetes Mother   . Cancer Father     Review of Systems:  As stated in the HPI and otherwise negative.   Ht '5\' 6"'  (1.676 m)   Wt 150 lb (68 kg)   BMI 24.21 kg/m   Physical Examination:  General: Thin, elderly female in NAD. She is wearing supplemental O2. HEENT: OP clear, mucus membranes moist  SKIN: warm, dry. No rashes. Neuro: No focal deficits  Musculoskeletal: Muscle strength 5/5 all ext  Psychiatric: Mood and affect normal  Neck: No JVD, no carotid bruits, no thyromegaly, no lymphadenopathy.  Lungs:Clear bilaterally, no wheezes, rhonci, crackles Cardiovascular: Regular rate and rhythm. Loud, harsh systolic murmur. No gallops or rubs. Abdomen:Soft. Bowel sounds present. Non-tender.  Extremities: No lower extremity edema. Pulses are 2 + in the bilateral DP/PT.  Echo 08/31/16: - Left ventricle: The cavity size was normal. Wall thickness was   increased in a pattern of moderate LVH. There was focal basal   hypertrophy. Systolic function was normal. The estimated ejection   fraction was in the range  of 60% to 65%. Features are consistent   with a pseudonormal left ventricular filling pattern, with   concomitant abnormal relaxation and increased filling pressure   (grade 2 diastolic dysfunction). - Aortic valve: There was severe stenosis. There was moderate   regurgitation. - Right ventricle: The cavity size was mildly dilated. - Tricuspid valve: There was moderate regurgitation. - Pulmonary arteries: Systolic pressure was mildly increased. PA   peak pressure: 38 mm Hg (S). - Pericardium, extracardiac: There was a left pleural effusion. Impressions: - The aortic valve is severely calcified.  EKG:  EKG is not ordered today. The ekg ordered today demonstrates   Recent Labs: 06/18/2016: ALT 9 09/13/2016: Hemoglobin 8.4; Platelets 128 09/14/2016: BUN 59; Creatinine, Ser 2.60; Potassium 4.5; Sodium 138   Lipid Panel No results found for: CHOL, TRIG, HDL, CHOLHDL, VLDL, LDLCALC, LDLDIRECT   Wt Readings  from Last 3 Encounters:  09/26/16 150 lb (68 kg)  09/14/16 168 lb 1.6 oz (76.2 kg)  08/22/16 174 lb (78.9 kg)     Other studies Reviewed: Additional studies/ records that were reviewed today include: . Review of the above records demonstrates:   STS Risk Score:  Risk of Mortality: 11.007%  Morbidity or Mortality: 41.822%  Long Length of Stay: 27.301%  Short Length of Stay: 5.687%  Permanent Stroke: 4.964%  Prolonged Ventilation: 30.779%  DSW Infection: 0.231%  Renal Failure: 24.74%  Reoperation: 12.085%    Assessment and Plan:   1. Severe aortic valve stenosis: Ms. Worsley has stage D symptomatic aortic stenosis. Her dyspnea and volume overload are likely worsened by her severe aortic stenosis. She has made a dramatic improvement with diuresis. We had discussed moving forward with her TAVR workup but his has been put on hold given her recent acute renal failure with small contrast volume during her cath and subsequent diuresis. She was followed closely while admitted by our  Nephrology team who will not offer her dialysis given her advanced age. I do not think we should move forward with planning for TAVR at this time as the risk of contrast induced nephropathy is high during Pre TAVR CT scans and during the TAVR procedure itself. If she is not offered dialysis as a potential option, we cannot safely proceed with TAVR. She had a BMET two days ago in Dr. Marcial Pacas office. I will try to get that sent up to review. I will review her case with our TAVR team and will see her back in 6 weeks to make final plans regarding TAVR. Her family understands that we may not be able to proceed with TAVR given her chronic kidney disease.   Current medicines are reviewed at length with the patient today.  The patient does not have concerns regarding medicines.  The following changes have been made:  no change  Labs/ tests ordered today include:   No orders of the defined types were placed in this encounter.  Disposition:   Follow up with me in 6 weeks  Signed, [name], MD 09/26/2016 3:45 PM    [facility] [address], [place], [place]  [address] Phone: [phone]; Fax: [phone]

## 2016-09-26 NOTE — Patient Instructions (Signed)
Medication Instructions:  Your physician recommends that you continue on your current medications as directed. Please refer to the Current Medication list given to you today.   Labwork: none  Testing/Procedures: none  Follow-Up: Your physician recommends that you schedule a follow-up appointment in about 6 weeks. Scheduled for September 28,2018 at 3:20    Any Other Special Instructions Will Be Listed Below (If Applicable).     If you need a refill on your cardiac medications before your next appointment, please call your pharmacy.

## 2016-10-16 ENCOUNTER — Other Ambulatory Visit: Payer: Self-pay | Admitting: Cardiology

## 2016-11-09 ENCOUNTER — Ambulatory Visit: Payer: Medicare Other | Admitting: Cardiovascular Disease

## 2016-11-09 NOTE — Progress Notes (Deleted)
No chief complaint on file.   History of Present Illness: 83 yo female with history of diabetes mellitus, hypertension, hyperlipidemia, chronic kidney disease, chronic diastolic CHF, severe aortic stenosis and moderate aortic regurgitation who is here today for cardiac follow up. She is followed in [facility]. She was referred to our valve clinic in July 2018 by Dr. Woody Seller to discuss TAVR as a treatment option for her severe AS. She described progressive dyspnea with exertion for several months. She has been on Lasix for lower extremity edema. She also has dizziness with near syncope. Echocardiogram in the [facility] office march 13,2018 showed mildly dilated LV with concentric hypertrophy of the LV. There was grade 2 diastolic dysfunction. LVEF >65%. There was severe aortic valve stenosis with mean gradient of 47 mmHg, peak gradient of 76 mmHg, AVA 0.74 cm2. There is mild mitral regurgitation and moderate tricuspid regurgitation. Echo in our office July 2018 showed moderate LVH, normal LV systolic function and severe AS with mean gradient of 88 mmHg and peak gradient of 155 mmHg, moderate AI and trivial MR. She was interested in pursuing TAVR so cardiac cath was arranged on 09/03/16. She had mild non-obstructive CAD. By cath, her mean gradient across the aortic valve was 47.6 mmHg and her peak gradient was 64 mmHg). She was dyspneic and wheezing on her cath day with evidence of volume overload and was admitted to our inpatient service. LVEDP 35 mmHg by cath with mean PA pressure of 48 mmHg. With diuresis, she had acute on chronic renal failure with peak creatinine of 3.14. She was seen by Nephrology and was not felt to be a candidate for long term dialysis. Per discussions with Nephrology, her risk of of contrast induced renal failure is high and invasive procedures such as TAVR would not be recommended. She was dependent upon supplemental oxygen despite diuresis (net negative 3.9 liters of  fluid) and was discharged home on supplemental oxygen therapy. I saw her back in our office 09/26/16 and she was stable. At that time, our plan was to have a thorough multi-disciplinary review of her case with the valve team and then make recommendations for possible treatment. She has been followed closely in [facility] by Dr. Virgina Jock.   She is here today for follow up. The patient denies any chest pain, dyspnea, palpitations, lower extremity edema, orthopnea, PND, dizziness, near syncope or syncope.      Primary Care Physician: Jilda Panda, MD Primary Cardiologist: Dr. Vear Clock [facility])   Past Medical History:  Diagnosis Date  . Aortic stenosis   . Chronic diastolic CHF (congestive heart failure) ([place])   . Diabetes mellitus without complication ([place])   . Elevated BUN 08/2016  . Elevated creatine kinase   . High cholesterol   . Hypertension     Past Surgical History:  Procedure Laterality Date  . ABDOMINAL HYSTERECTOMY    . RIGHT/LEFT HEART CATH AND CORONARY ANGIOGRAPHY N/A 09/03/2016   Procedure: Right/Left Heart Cath and Coronary Angiography;  Surgeon: Burnell Blanks, MD;  Location: [place] [facility];  Service: Cardiovascular;  Laterality: N/A;    Current Outpatient Prescriptions  Medication Sig Dispense Refill  . albuterol (PROVENTIL HFA;VENTOLIN HFA) 108 (90 Base) MCG/ACT inhaler Inhale 3 puffs into the lungs every 6 (six) hours as needed for wheezing or shortness of breath.    Marland Kitchen amLODipine (NORVASC) 10 MG tablet TAKE 1 TABLET BY MOUTH ONCE DAILY 90 tablet 3  . aspirin EC 81 MG tablet Take  81 mg by mouth daily.    Marland Kitchen BIOTIN 5000 PO Take 5,000 mcg by mouth daily.    . cholecalciferol (VITAMIN D) 400 units TABS tablet Take 400 Units by mouth daily.    . furosemide (LASIX) 80 MG tablet Take 80mg  (1 tablet) in the morning, and 40mg  (1/2 tablet in the evening) 60 tablet 1  . hydrALAZINE (APRESOLINE) 25 MG tablet Take 1 tablet (25 mg total) by  mouth every 8 (eight) hours. 90 tablet 1  . meclizine (ANTIVERT) 25 MG tablet Take 0.5-1 tablets (12.5-25 mg total) by mouth 3 (three) times daily as needed for dizziness. 15 tablet 0  . nebivolol (BYSTOLIC) 5 MG tablet Take 2.5 mg by mouth daily.     . rosuvastatin (CRESTOR) 20 MG tablet Take 20 mg by mouth daily.    . vitamin C (ASCORBIC ACID) 500 MG tablet Take 500 mg by mouth daily.     No current facility-administered medications for this visit.     Allergies  Allergen Reactions  . Penicillins Rash    Social History   Social History  . Marital status: Single    Spouse name: N/A  . Number of children: 2  . Years of education: N/A   Occupational History  . Hosiery mill, school cafeteria-retired    Social History Main Topics  . Smoking status: Never Smoker  . Smokeless tobacco: Never Used  . Alcohol use No  . Drug use: No  . Sexual activity: Not on file   Other Topics Concern  . Not on file   Social History Narrative  . No narrative on file    Family History  Problem Relation Age of Onset  . Diabetes Mother   . Cancer Father     Review of Systems:  As stated in the HPI and otherwise negative.   There were no vitals taken for this visit.  Physical Examination:  General: Well developed, well nourished, NAD  HEENT: OP clear, mucus membranes moist  SKIN: warm, dry. No rashes. Neuro: No focal deficits  Musculoskeletal: Muscle strength 5/5 all ext  Psychiatric: Mood and affect normal  Neck: No JVD, no carotid bruits, no thyromegaly, no lymphadenopathy.  Lungs:Clear bilaterally, no wheezes, rhonci, crackles Cardiovascular: Regular rate and rhythm. No murmurs, gallops or rubs. Abdomen:Soft. Bowel sounds present. Non-tender.  Extremities: No lower extremity edema. Pulses are 2 + in the bilateral DP/PT.   Echo 08/31/16: - Left ventricle: The cavity size was normal. Wall thickness was   increased in a pattern of moderate LVH. There was focal basal    hypertrophy. Systolic function was normal. The estimated ejection   fraction was in the range of 60% to 65%. Features are consistent   with a pseudonormal left ventricular filling pattern, with   concomitant abnormal relaxation and increased filling pressure   (grade 2 diastolic dysfunction). - Aortic valve: There was severe stenosis. There was moderate   regurgitation. - Right ventricle: The cavity size was mildly dilated. - Tricuspid valve: There was moderate regurgitation. - Pulmonary arteries: Systolic pressure was mildly increased. PA   peak pressure: 38 mm Hg (S). - Pericardium, extracardiac: There was a left pleural effusion. Impressions: - The aortic valve is severely calcified.  EKG:  EKG is not *** ordered today. The ekg ordered today demonstrates   Recent Labs: 06/18/2016: ALT 9 09/13/2016: Hemoglobin 8.4; Platelets 128 09/14/2016: BUN 59; Creatinine, Ser 2.60; Potassium 4.5; Sodium 138   Lipid Panel No results found for: CHOL, TRIG, HDL, CHOLHDL,  VLDL, LDLCALC, LDLDIRECT   Wt Readings from Last 3 Encounters:  09/26/16 150 lb (68 kg)  09/14/16 168 lb 1.6 oz (76.2 kg)  08/22/16 174 lb (78.9 kg)     Other studies Reviewed: Additional studies/ records that were reviewed today include: . Review of the above records demonstrates:   STS Risk Score:  Risk of Mortality: 11.007%  Morbidity or Mortality: 41.822%  Long Length of Stay: 27.301%  Short Length of Stay: 5.687%  Permanent Stroke: 4.964%  Prolonged Ventilation: 30.779%  DSW Infection: 0.231%  Renal Failure: 24.74%  Reoperation: 12.085%    Assessment and Plan:   1. Severe aortic valve stenosis: She has stage D symptomatic aortic stenosis and evidence of volume overload over the last few months, likely due to her aortic stenosis and chronic kidney disease. She has fortunately had improvement in her dyspnea with diuresis. She has chronic kidney disease and has been told by Nephrology that she is not a candidate  for dialysis should it be needed. Her TAVR workup has been stopped at this time due to her renal issues and the lack of a reasonable expectation of support with hemodialysis should it be required post procedure. As noted above, she had acute renal failure with small contrast volume during her cath and subsequent diuresis. I have reviewed her case in depth with our multi-disciplinary valve team including the other interventional cardiologist on our team and both CT surgeons. We do not think she is a candidate for TAVR. This is difficult to tell the patient and her family but it is felt to be the right clinical decision.    Current medicines are reviewed at length with the patient today.  The patient does not have concerns regarding medicines.  The following changes have been made:  no change  Labs/ tests ordered today include:   No orders of the defined types were placed in this encounter.  Disposition:   Follow up with Dr. Virgina Jock in [facility] as planned.   Signed, [name], MD 11/09/2016 10:58 AM    [facility] [address], [place], [place]  [address] Phone: [phone]; Fax: [phone]

## 2016-11-29 ENCOUNTER — Ambulatory Visit: Payer: Medicare Other | Admitting: Cardiovascular Disease

## 2016-11-29 NOTE — Progress Notes (Deleted)
No chief complaint on file.   History of Present Illness: 83 yo female with history of diabetes mellitus, hypertension, hyperlipidemia, chronic kidney disease, severe aortic stenosis and moderate aortic regurgitation who is here today for cardiac follow up. She is followed in [facility] by Dr. Woody Seller who referred her to our office to discuss TAVR. I met her on 08/22/16. She described progressive dyspnea with exertion for several months. She has been on Lasix for lower extremity edema. She also has dizziness with near syncope. Echocardiogram in the [facility] office march 13,2018 showed mildly dilated LV with concentric hypertrophy of the LV. There was grade 2 diastolic dysfunction. LVEF >65%. There was severe aortic valve stenosis with mean gradient of 47 mmHg, peak gradient of 76 mmHg, AVA 0.74 cm2. There is mild mitral regurgitation and moderate tricuspid regurgitation. Echo in our office July 2018 showed moderate LVH, normal LV systolic function and severe AS with mean gradient of 88 mmHg and peak gradient of 155 mmHg, moderate AI and trivial MR. She was interested in pursuing TAVR so cardiac cath was arranged on 09/03/16. She had mild non-obstructive CAD. By cath, her mean gradient across the aortic valve was 47.6 mmHg and her peak gradient was 64 mmHg). She was dyspneic and wheezing on her cath day with evidence of volume overload and was admitted to our inpatient service. LVEDP 35 mmHg by cath with mean PA pressure of 48 mmHg. With diuresis, she had acute on chronic renal failure with peak creatinine of 3.14. She was seen by Nephrology and was not felt to be a candidate for long term dialysis. Per discussions with Nephrology, Her risk of of contrast induced renal failure is high and invasive procedures such as TAVR would not be recommended. She was dependent upon supplemental oxygen despite diuresis (net negative 3.9 liters of fluid) and was discharged home on supplemental oxygen  therapy. I saw her back in the valve clinic 09/26/16 and discussed her condition with the patient and her family. She was feeling much better following her discharge and reported 24 lb weight loss since discharge.   She is here today for follow up. The patient denies any chest pain, dyspnea, palpitations, lower extremity edema, orthopnea, PND, dizziness, near syncope or syncope.   Primary Care Physician: Jilda Panda, MD Primary Cardiologist: Dr. Vear Clock [facility])   Past Medical History:  Diagnosis Date  . Aortic stenosis   . Chronic diastolic CHF (congestive heart failure) ([place])   . Diabetes mellitus without complication ([place])   . Elevated BUN 08/2016  . Elevated creatine kinase   . High cholesterol   . Hypertension     Past Surgical History:  Procedure Laterality Date  . ABDOMINAL HYSTERECTOMY    . RIGHT/LEFT HEART CATH AND CORONARY ANGIOGRAPHY N/A 09/03/2016   Procedure: Right/Left Heart Cath and Coronary Angiography;  Surgeon: Burnell Blanks, MD;  Location: [place] [facility];  Service: Cardiovascular;  Laterality: N/A;    Current Outpatient Prescriptions  Medication Sig Dispense Refill  . albuterol (PROVENTIL HFA;VENTOLIN HFA) 108 (90 Base) MCG/ACT inhaler Inhale 3 puffs into the lungs every 6 (six) hours as needed for wheezing or shortness of breath.    Marland Kitchen amLODipine (NORVASC) 10 MG tablet TAKE 1 TABLET BY MOUTH ONCE DAILY 90 tablet 3  . aspirin EC 81 MG tablet Take 81 mg by mouth daily.    Marland Kitchen BIOTIN 5000 PO Take 5,000 mcg by mouth daily.    . cholecalciferol (VITAMIN D) 400 units  TABS tablet Take 400 Units by mouth daily.    . furosemide (LASIX) 80 MG tablet Take 67m (1 tablet) in the morning, and 419m(1/2 tablet in the evening) 60 tablet 1  . hydrALAZINE (APRESOLINE) 25 MG tablet Take 1 tablet (25 mg total) by mouth every 8 (eight) hours. 90 tablet 1  . meclizine (ANTIVERT) 25 MG tablet Take 0.5-1 tablets (12.5-25 mg total) by mouth 3 (three) times  daily as needed for dizziness. 15 tablet 0  . nebivolol (BYSTOLIC) 5 MG tablet Take 2.5 mg by mouth daily.     . rosuvastatin (CRESTOR) 20 MG tablet Take 20 mg by mouth daily.    . vitamin C (ASCORBIC ACID) 500 MG tablet Take 500 mg by mouth daily.     No current facility-administered medications for this visit.     Allergies  Allergen Reactions  . Penicillins Rash    Social History   Social History  . Marital status: Single    Spouse name: N/A  . Number of children: 2  . Years of education: N/A   Occupational History  . Hosiery mill, school cafeteria-retired    Social History Main Topics  . Smoking status: Never Smoker  . Smokeless tobacco: Never Used  . Alcohol use No  . Drug use: No  . Sexual activity: Not on file   Other Topics Concern  . Not on file   Social History Narrative  . No narrative on file    Family History  Problem Relation Age of Onset  . Diabetes Mother   . Cancer Father     Review of Systems:  As stated in the HPI and otherwise negative.   There were no vitals taken for this visit.  Physical Examination: *** General: Thin elderly female in NAD.  HEENT: OP clear, mucus membranes moist  SKIN: warm, dry. No rashes. Neuro: No focal deficits  Musculoskeletal: Muscle strength 5/5 all ext  Psychiatric: Mood and affect normal  Neck: No JVD, no carotid bruits, no thyromegaly, no lymphadenopathy.  Lungs:Clear bilaterally, no wheezes, rhonci, crackles Cardiovascular: Regular rate and rhythm. Loud harsh, late peaking systolic murmur.  Abdomen:Soft. Bowel sounds present. Non-tender.  Extremities: No lower extremity edema. Pulses are 2 + in the bilateral DP/PT.  Echo 08/31/16: - Left ventricle: The cavity size was normal. Wall thickness was   increased in a pattern of moderate LVH. There was focal basal   hypertrophy. Systolic function was normal. The estimated ejection   fraction was in the range of 60% to 65%. Features are consistent   with a  pseudonormal left ventricular filling pattern, with   concomitant abnormal relaxation and increased filling pressure   (grade 2 diastolic dysfunction). - Aortic valve: There was severe stenosis. There was moderate   regurgitation. - Right ventricle: The cavity size was mildly dilated. - Tricuspid valve: There was moderate regurgitation. - Pulmonary arteries: Systolic pressure was mildly increased. PA   peak pressure: 38 mm Hg (S). - Pericardium, extracardiac: There was a left pleural effusion. Impressions: - The aortic valve is severely calcified.  EKG:  EKG is not *** ordered today. The ekg ordered today demonstrates   Recent Labs: 06/18/2016: ALT 9 09/13/2016: Hemoglobin 8.4; Platelets 128 09/14/2016: BUN 59; Creatinine, Ser 2.60; Potassium 4.5; Sodium 138   Lipid Panel No results found for: CHOL, TRIG, HDL, CHOLHDL, VLDL, LDLCALC, LDLDIRECT   Wt Readings from Last 3 Encounters:  09/26/16 150 lb (68 kg)  09/14/16 168 lb 1.6 oz (76.2 kg)  08/22/16 174 lb (78.9 kg)     Other studies Reviewed: Additional studies/ records that were reviewed today include: . Review of the above records demonstrates:   STS Risk Score:  Risk of Mortality: 11.007%  Morbidity or Mortality: 41.822%  Long Length of Stay: 27.301%  Short Length of Stay: 5.687%  Permanent Stroke: 4.964%  Prolonged Ventilation: 30.779%  DSW Infection: 0.231%  Renal Failure: 24.74%  Reoperation: 12.085%    Assessment and Plan:   1. Severe aortic valve stenosis:  I am seeing Ms. Amber [name] back today to discuss her severe aortic valve stenosis. She has stage D symptomatic aortic stenosis. It is felt that her dyspnea and volume overload over the past few months has been related to her aortic stenosis. Fortunately, she has responded well to diuresis and is overall improved from a symptom standpoint. She was seen by our Nephrology team while admitted in July 2018. She had significant worsening of her renal function post cath  with a small contrast load and subsequent diuresis. Our Nephrology team does not feel that she is a candidate for dialysis should she have worsening of her renal function. We had discussed TAVR but after review by our multidisciplinary TAVR team, she is not felt to be an optimal candidate for TAVR given her stage IV CKD. We will not move forward with TAVR given advanced age, stage IV CKD. She will continue to follow in [facility] with Dr. Jenita Seashore.    Current medicines are reviewed at length with the patient today.  The patient does not have concerns regarding medicines.  The following changes have been made:  no change  Labs/ tests ordered today include:   No orders of the defined types were placed in this encounter.  Disposition:   No follow up planned in valve clinic.   Signed, [name], MD 11/29/2016 1:29 PM    [facility] [address], [place], [place]  [address] Phone: [phone]; Fax: [phone]

## 2017-01-24 ENCOUNTER — Other Ambulatory Visit: Payer: Self-pay | Admitting: Cardiology

## 2017-10-20 ENCOUNTER — Emergency Department (HOSPITAL_BASED_OUTPATIENT_CLINIC_OR_DEPARTMENT_OTHER)
Admission: EM | Admit: 2017-10-20 | Discharge: 2017-10-20 | Disposition: A | Discharge status: Home / Self Care | Payer: Medicare Other | Attending: Emergency Medicine | Admitting: Emergency Medicine

## 2017-10-20 ENCOUNTER — Encounter (HOSPITAL_BASED_OUTPATIENT_CLINIC_OR_DEPARTMENT_OTHER): Payer: Self-pay | Admitting: Emergency Medicine

## 2017-10-20 ENCOUNTER — Other Ambulatory Visit: Payer: Self-pay

## 2017-10-20 DIAGNOSIS — Z2914 Encounter for prophylactic rabies immune globin: Secondary | ICD-10-CM | POA: Insufficient documentation

## 2017-10-20 DIAGNOSIS — Y999 Unspecified external cause status: Secondary | ICD-10-CM | POA: Diagnosis not present

## 2017-10-20 DIAGNOSIS — E1122 Type 2 diabetes mellitus with diabetic chronic kidney disease: Secondary | ICD-10-CM | POA: Insufficient documentation

## 2017-10-20 DIAGNOSIS — I130 Hypertensive heart and chronic kidney disease with heart failure and stage 1 through stage 4 chronic kidney disease, or unspecified chronic kidney disease: Secondary | ICD-10-CM | POA: Insufficient documentation

## 2017-10-20 DIAGNOSIS — Z79899 Other long term (current) drug therapy: Secondary | ICD-10-CM | POA: Diagnosis not present

## 2017-10-20 DIAGNOSIS — W5503XA Scratched by cat, initial encounter: Secondary | ICD-10-CM | POA: Diagnosis not present

## 2017-10-20 DIAGNOSIS — Y93K9 Activity, other involving animal care: Secondary | ICD-10-CM | POA: Insufficient documentation

## 2017-10-20 DIAGNOSIS — Z23 Encounter for immunization: Secondary | ICD-10-CM

## 2017-10-20 DIAGNOSIS — N189 Chronic kidney disease, unspecified: Secondary | ICD-10-CM | POA: Insufficient documentation

## 2017-10-20 DIAGNOSIS — Y929 Unspecified place or not applicable: Secondary | ICD-10-CM | POA: Insufficient documentation

## 2017-10-20 DIAGNOSIS — S61211A Laceration without foreign body of left index finger without damage to nail, initial encounter: Secondary | ICD-10-CM | POA: Insufficient documentation

## 2017-10-20 DIAGNOSIS — I5032 Chronic diastolic (congestive) heart failure: Secondary | ICD-10-CM | POA: Diagnosis not present

## 2017-10-20 DIAGNOSIS — Z7982 Long term (current) use of aspirin: Secondary | ICD-10-CM | POA: Insufficient documentation

## 2017-10-20 LAB — CBG MONITORING, ED: Glucose-Capillary: 153 mg/dL — ABNORMAL HIGH (ref 70–99)

## 2017-10-20 MED ORDER — DOXYCYCLINE HYCLATE 100 MG PO CAPS: 100.0000 mg | ORAL_CAPSULE | Freq: Two times a day (BID) | ORAL | 0 refills | Status: AC

## 2017-10-20 MED ORDER — RABIES IMMUNE GLOBULIN 150 UNIT/ML IM INJ
20.0000 [IU]/kg | INJECTION | Freq: Once | INTRAMUSCULAR | Status: AC
  Administered 2017-10-20: 1425 [IU] via INTRAMUSCULAR
  Filled 2017-10-20: qty 20

## 2017-10-20 MED ORDER — RABIES VACCINE, PCEC IM SUSR
1.0000 mL | Freq: Once | INTRAMUSCULAR | Status: AC
  Administered 2017-10-20: 1 mL via INTRAMUSCULAR
  Filled 2017-10-20: qty 1

## 2017-10-20 MED ORDER — TETANUS-DIPHTH-ACELL PERTUSSIS 5-2.5-18.5 LF-MCG/0.5 IM SUSP
0.5000 mL | Freq: Once | INTRAMUSCULAR | Status: AC
  Administered 2017-10-20: 0.5 mL via INTRAMUSCULAR
  Filled 2017-10-20: qty 0.5

## 2017-10-20 MED ORDER — METRONIDAZOLE 500 MG PO TABS
500.0000 mg | ORAL_TABLET | Freq: Once | ORAL | Status: AC
  Administered 2017-10-20: 500 mg via ORAL
  Filled 2017-10-20: qty 1

## 2017-10-20 MED ORDER — DOXYCYCLINE HYCLATE 100 MG PO TABS
100.0000 mg | ORAL_TABLET | Freq: Once | ORAL | Status: AC
  Administered 2017-10-20: 100 mg via ORAL
  Filled 2017-10-20: qty 1

## 2017-10-20 MED ORDER — METRONIDAZOLE 500 MG PO TABS: 500.0000 mg | ORAL_TABLET | Freq: Two times a day (BID) | ORAL | 0 refills | Status: AC

## 2017-10-20 NOTE — ED Notes (Signed)
Left index finger wound irrigated w/ sterile saline and non-toxic wound cleanser applied. Pt soaked left index finger in saline/iodine solution.

## 2017-10-20 NOTE — Discharge Instructions (Signed)
Take doxycycline, flagyl to prevent infection from the cat scratch.   Keep the wound clean.   You need further vaccinations for rabies. Your doctor may carry the vaccine or urgent care may have it as well   See your doctor this week   Return to ER if you have fever, purulent discharge from the wound, worse redness around the wound.

## 2017-10-20 NOTE — ED Triage Notes (Signed)
Pt was bitten/scratched by a stray cat to L index finger.

## 2017-10-20 NOTE — ED Provider Notes (Signed)
[facility] EMERGENCY DEPARTMENT Provider Note   CSN: [id number] Arrival date & time: 10/20/17  1457     History   Chief Complaint Chief Complaint  Patient presents with  . Animal Bite    HPI Amber [name] is a 84 y.o. female hx of DM, CHF, renal insufficiency, here presenting with possible cat scratch.  Patient states that earlier today, a stray cat scratched her on her left second index finger.  She states that the cat was behaving normally but thought that she had some food so went up to her and scratched her.  She denies any fevers or chills or any swelling around the area.  Patient states that her tetanus is not up-to-date.  She states that the cat is likely not up-to-date with shots either.  The history is provided by the patient.    Past Medical History:  Diagnosis Date  . Aortic stenosis   . Chronic diastolic CHF (congestive heart failure) ([place])   . Diabetes mellitus without complication ([place])   . Elevated BUN 08/2016  . Elevated creatine kinase   . High cholesterol   . Hypertension     Patient Active Problem List   Diagnosis Date Noted  . CKD (chronic kidney disease)   . Aortic stenosis 09/04/2016  . Acute on chronic diastolic CHF (congestive heart failure) ([place]) 09/03/2016  . Severe aortic stenosis     Past Surgical History:  Procedure Laterality Date  . ABDOMINAL HYSTERECTOMY    . RIGHT/LEFT HEART CATH AND CORONARY ANGIOGRAPHY N/A 09/03/2016   Procedure: Right/Left Heart Cath and Coronary Angiography;  Surgeon: Burnell Blanks, MD;  Location: [facility];  Service: Cardiovascular;  Laterality: N/A;     OB History   None      Home Medications    Prior to Admission medications   Medication Sig Start Date End Date Taking? Authorizing Provider  albuterol (PROVENTIL HFA;VENTOLIN HFA) 108 (90 Base) MCG/ACT inhaler Inhale 3 puffs into the lungs every 6 (six) hours as needed for wheezing or shortness of breath.    [provider]  amLODipine (NORVASC) 10 MG tablet TAKE 1 TABLET BY MOUTH ONCE DAILY 10/18/16   Burnell Blanks, MD  aspirin EC 81 MG tablet Take 81 mg by mouth daily.    [provider]  BIOTIN 5000 PO Take 5,000 mcg by mouth daily.    [provider]  cholecalciferol (VITAMIN D) 400 units TABS tablet Take 400 Units by mouth daily.    [provider]  furosemide (LASIX) 80 MG tablet Take 80mg  (1 tablet) in the morning, and 40mg  (1/2 tablet in the evening) 09/13/16   Reino Bellis B, NP  hydrALAZINE (APRESOLINE) 25 MG tablet TAKE 1 TABLET BY MOUTH EVERY 8 HOURS 01/25/17   Jettie Booze, MD  meclizine (ANTIVERT) 25 MG tablet Take 0.5-1 tablets (12.5-25 mg total) by mouth 3 (three) times daily as needed for dizziness. 06/20/16   Mesner, Corene Cornea, MD  nebivolol (BYSTOLIC) 5 MG tablet Take 2.5 mg by mouth daily.     [provider]  rosuvastatin (CRESTOR) 20 MG tablet Take 20 mg by mouth daily.    [provider]  vitamin C (ASCORBIC ACID) 500 MG tablet Take 500 mg by mouth daily.    [provider]    Family History Family History  Problem Relation Age of Onset  . Diabetes Mother   . Cancer Father     Social History Social History   Tobacco  Use  . Smoking status: Never Smoker  . Smokeless tobacco: Never Used  Substance Use Topics  . Alcohol use: No  . Drug use: No     Allergies   Penicillins   Review of Systems Review of Systems  Skin: Positive for wound.  All other systems reviewed and are negative.    Physical Exam Updated Vital Signs BP 140/65   Pulse (!) 55   Temp 98.8 F (37.1 C) (Oral)   Resp 20   Ht 5\' 7"  (1.702 m)   Wt 69.4 kg   SpO2 94%   BMI 23.96 kg/m   Physical Exam  Constitutional: She is oriented to person, place, and time. She appears well-developed.  Well appearing for age   HENT:  Head: Normocephalic.  Eyes: Pupils are equal, round, and reactive to light.  Neck: Normal range of  motion. Neck supple.  Cardiovascular: Normal rate, regular rhythm and normal heart sounds.  Pulmonary/Chest: Effort normal and breath sounds normal.  Abdominal: Soft. Bowel sounds are normal. She exhibits no distension. There is no tenderness.  Musculoskeletal:  L second index finger with 3 cm laceration on the palmar aspect of the finger. No tendons exposed. Able to flex and extend PIP and DIP joints. 2+ radial pulse, nl capillary refill   Neurological: She is alert and oriented to person, place, and time.  Skin: Skin is warm.  Psychiatric: She has a normal mood and affect.  Nursing note and vitals reviewed.    ED Treatments / Results  Labs (all labs ordered are listed, but only abnormal results are displayed) Labs Reviewed  CBG MONITORING, ED - Abnormal; Notable for the following components:      Result Value   Glucose-Capillary 153 (*)    All other components within normal limits    EKG None  Radiology No results found.  Procedures Procedures (including critical care time)  Medications Ordered in ED Medications  Tdap (BOOSTRIX) injection 0.5 mL (has no administration in time range)  rabies vaccine (RABAVERT) injection 1 mL (has no administration in time range)  rabies immune globulin (HYPERAB/KEDRAB) injection 1,425 Units (has no administration in time range)  doxycycline (VIBRA-TABS) tablet 100 mg (has no administration in time range)  metroNIDAZOLE (FLAGYL) tablet 500 mg (has no administration in time range)     Initial Impression / Assessment and Plan / ED Course  I have reviewed the triage vital signs and the nursing notes.  Pertinent labs & imaging results that were available during my care of the patient were reviewed by me and considered in my medical decision making (see chart for details).    Amber [name] is a 84 y.o. female here with cat scratch earlier today. No obvious tendons exposed. Bleeding controlled. Neurovascular intact. Patient is diabetic  and CBG is 153 in the ED. I updated her tdap. I also gave her rabies vaccine and immunoglobulin. She has PCN allergy so will give doxycycline, flagyl for prophylaxis. I gave her the rabies vaccine schedule for follow up shots.    Final Clinical Impressions(s) / ED Diagnoses   Final diagnoses:  None    ED Discharge Orders    None       Drenda Freeze, MD 10/20/17 1544

## 2017-10-29 IMAGING — US US RENAL
1 series · 14 of 25 positions shown · non-contrast
Comparison: None.

CLINICAL DATA: [AGE] hypertensive diabetic female with
chronic kidney disease. Initial encounter.

EXAM:
RENAL / URINARY TRACT ULTRASOUND COMPLETE

[Series 1: us renal · 0.23mm/px · 14 of 35 slices shown]
[im 1/35]
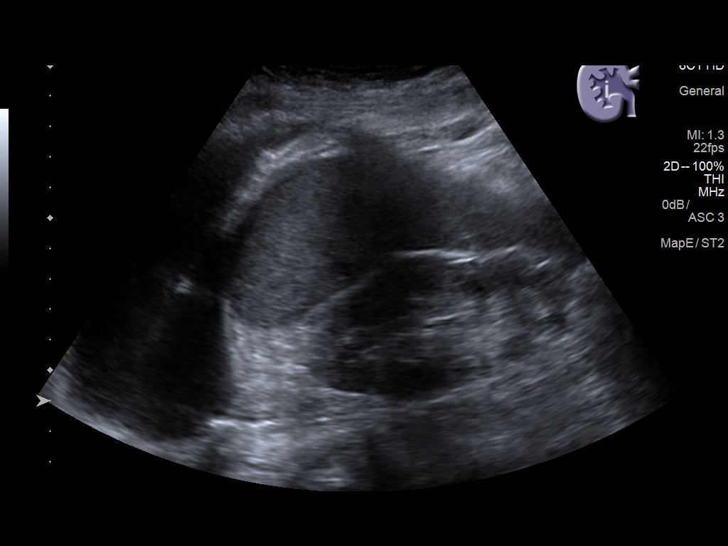
[im 3/35]
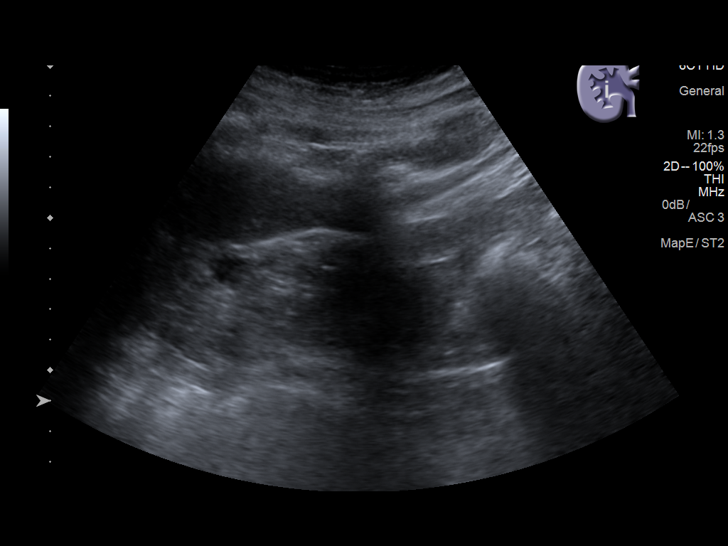
[im 6/35]
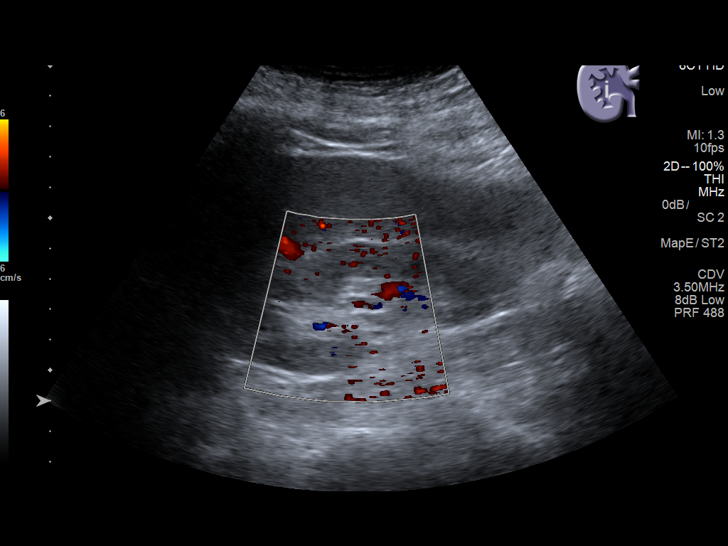
[im 9/35]
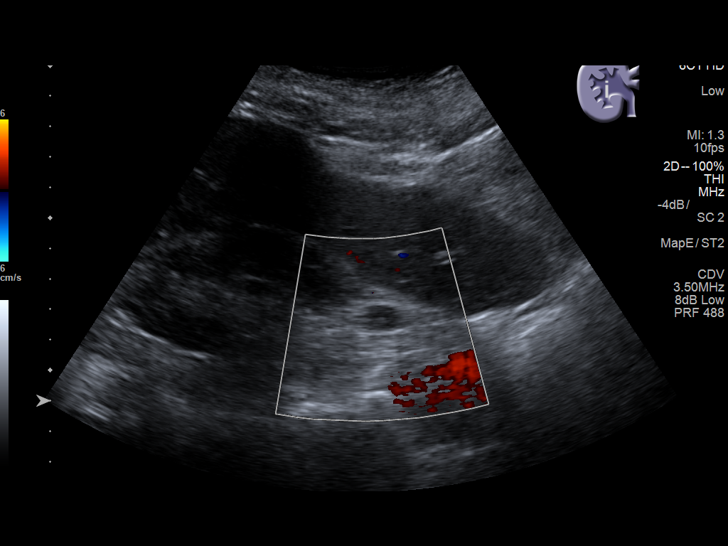
[im 12/35]
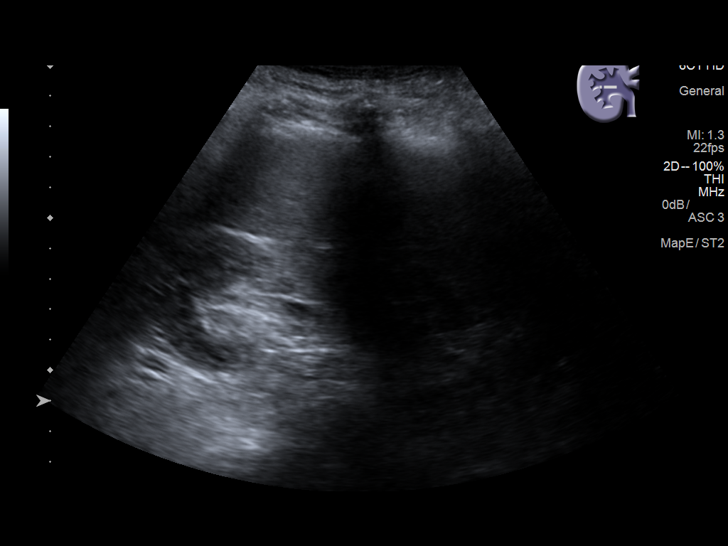
[im 13/35]
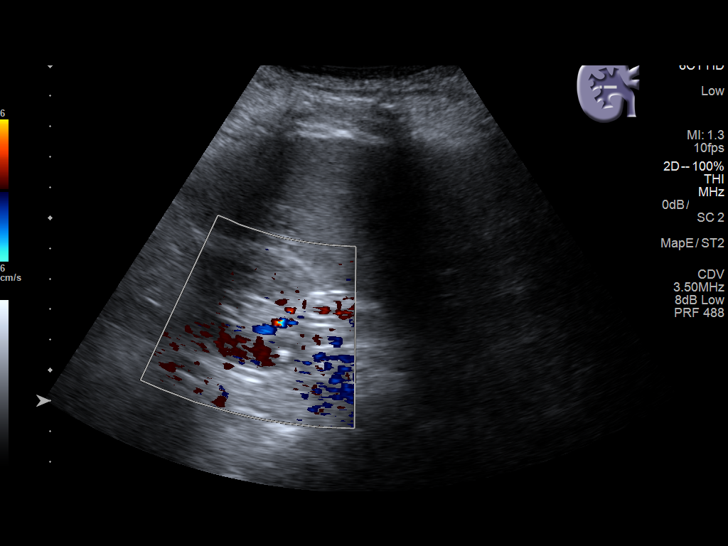
[im 16/35]
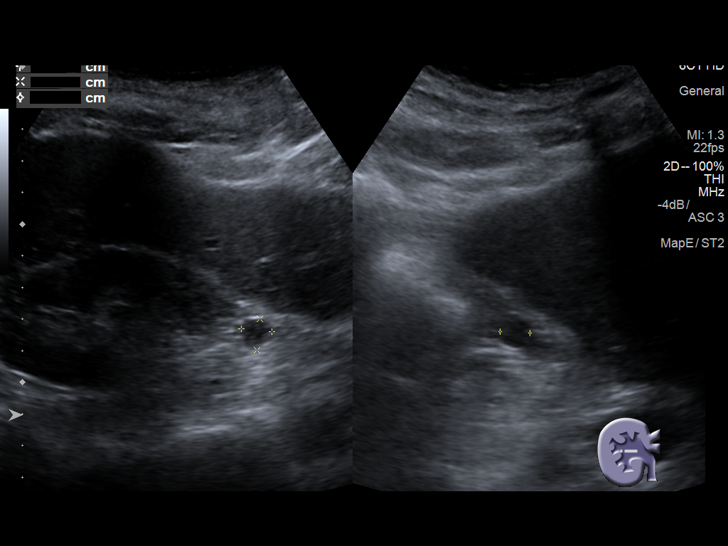
[im 19/35]
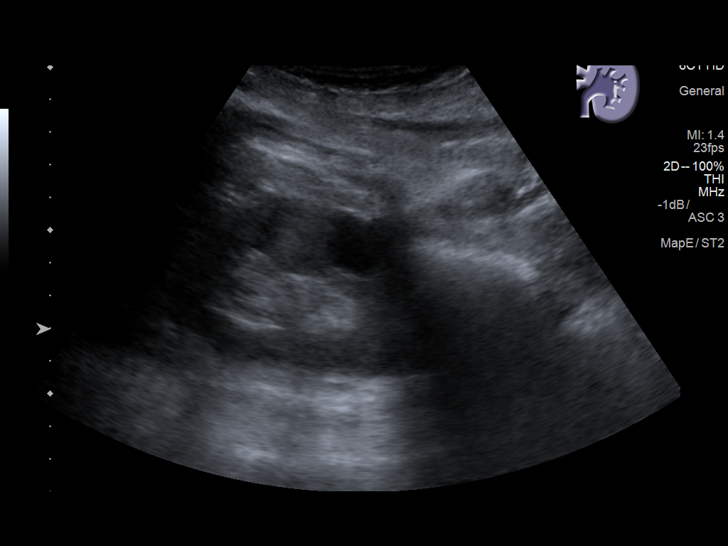
[im 22/35]
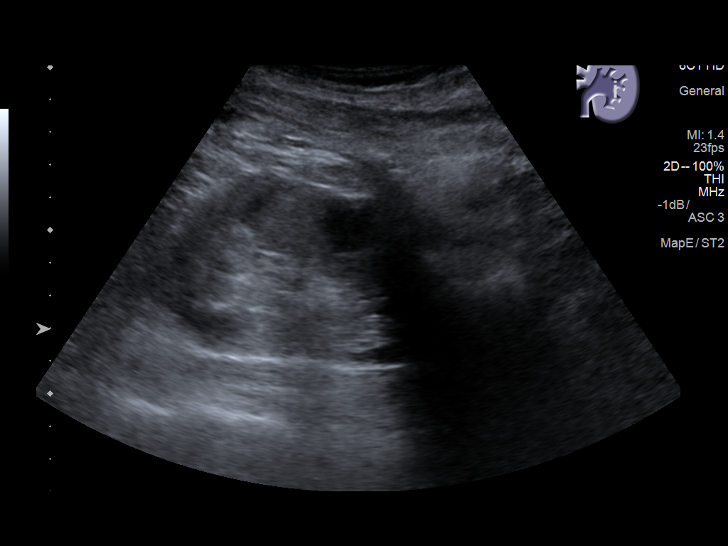
[im 23/35]
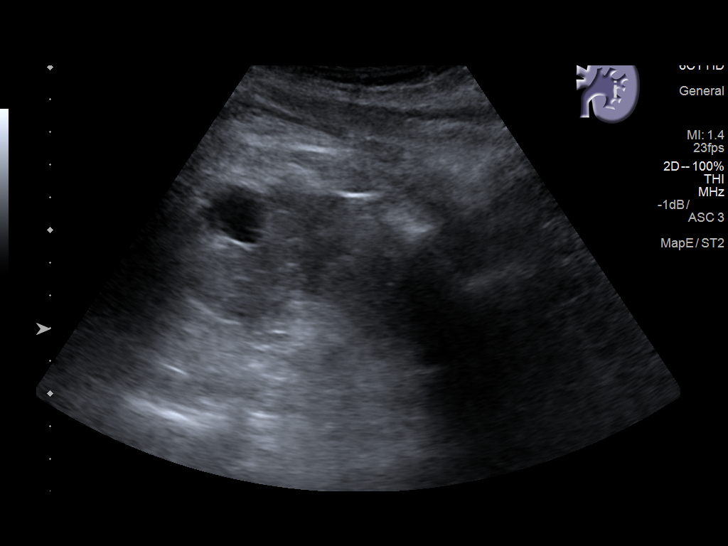
[im 26/35]
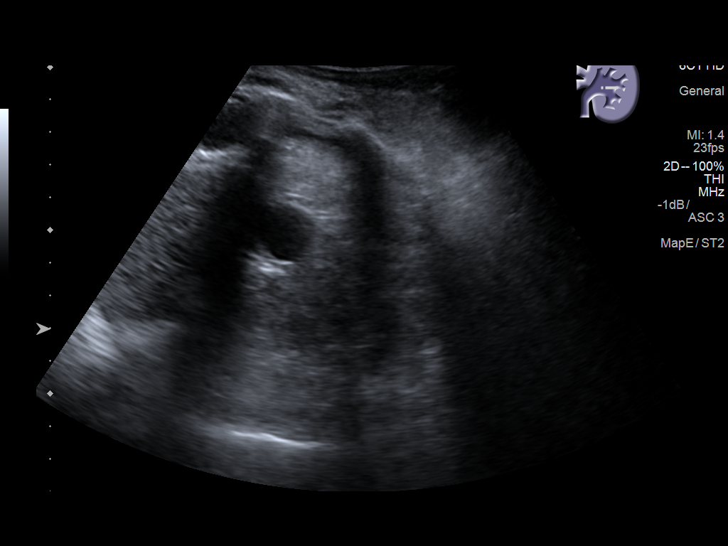
[im 29/35]
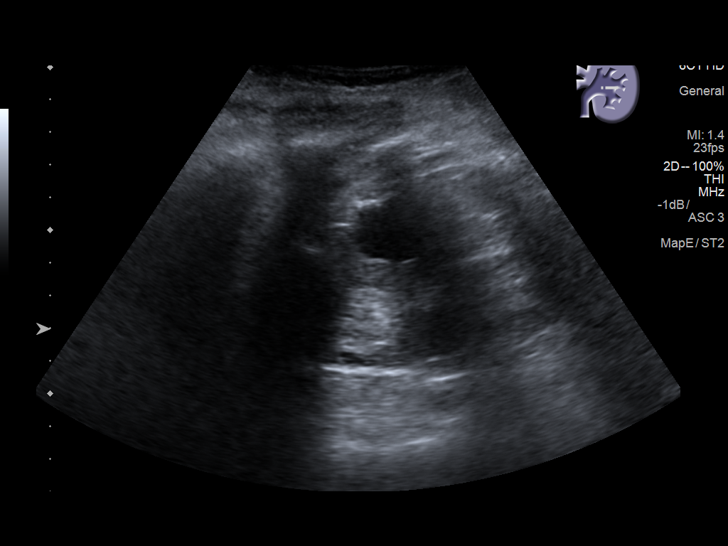
[im 32/35]
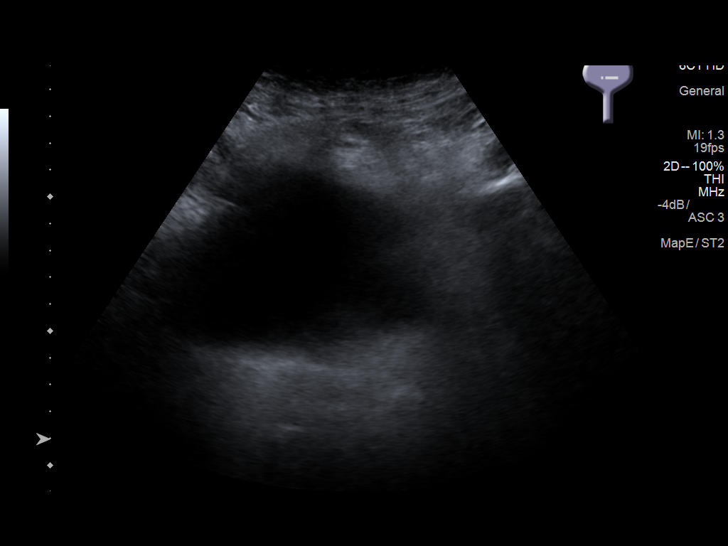
[im 35/35]
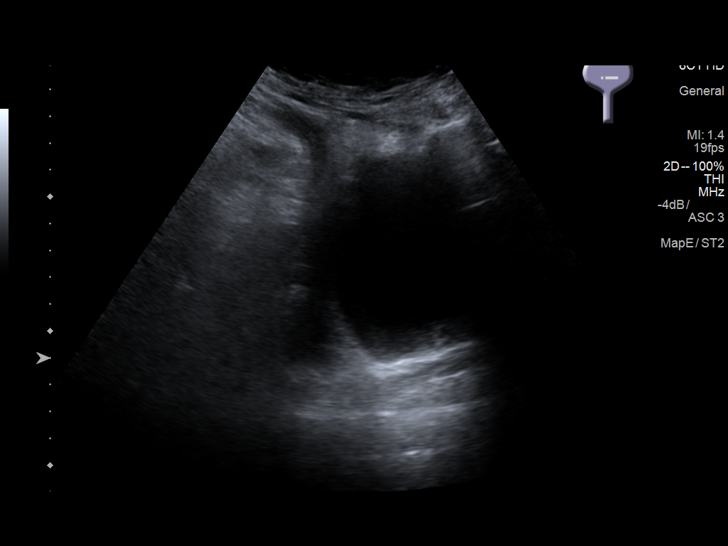

[14 of 25 positions shown; findings below may reference images not displayed]

FINDINGS: Right Kidney:

Length: 9.4 cm. Slightly echogenic without hydronephrosis. 1.2 and 1
cm cyst.

Left Kidney:

Length: 9.2 cm. Slightly echogenic without hydronephrosis. 1.9 and
2.2 cm cyst.

Bladder:

Appears normal for degree of bladder distention.

Bilateral pleural effusions.
IMPRESSION: Slight increased renal echogenicity bilaterally consistent with
changes of chronic kidney disease. No hydronephrosis.

Bilateral renal cysts.

Bilateral pleural effusions.

## 2018-08-08 ENCOUNTER — Other Ambulatory Visit: Payer: Self-pay

## 2018-08-08 ENCOUNTER — Encounter: Payer: Self-pay | Admitting: Cardiology

## 2018-08-08 ENCOUNTER — Ambulatory Visit: Payer: Medicare Other | Admitting: Cardiology

## 2018-08-08 VITALS — BP 152/59 | HR 58 | Temp 97.7°F | Ht 67.0 in | Wt 168.0 lb

## 2018-08-08 DIAGNOSIS — I129 Hypertensive chronic kidney disease with stage 1 through stage 4 chronic kidney disease, or unspecified chronic kidney disease: Secondary | ICD-10-CM

## 2018-08-08 DIAGNOSIS — Z7189 Other specified counseling: Secondary | ICD-10-CM

## 2018-08-08 DIAGNOSIS — I350 Nonrheumatic aortic (valve) stenosis: Secondary | ICD-10-CM | POA: Diagnosis not present

## 2018-08-08 DIAGNOSIS — N184 Chronic kidney disease, stage 4 (severe): Secondary | ICD-10-CM | POA: Diagnosis not present

## 2018-08-08 MED ORDER — FUROSEMIDE 40 MG PO TABS: ORAL_TABLET | ORAL | 2 refills | Status: AC

## 2018-08-08 NOTE — Progress Notes (Signed)
Virtual Visit via Video Note   Subjective:   Amber [name], female    DOB: Jul 06, 1921, 85 y.o.   MRN: [id number]   Chief complaint:  Shortness of breath, leg edema   HPI   85 year old African-American female with hypertension, type II diabetes mellitus, CK stage IV, critical aortic stenosis unfortunately not a candidate for TAVR due to renal insufficiency.  Patient is here today with her daughter Verdis Frederickson, with two other sons on video conference.   Over a period of time, patient has had worsening shortness of breath and leg edema. She has been using oxygen throughout the day now. She also complains of fatigue and loss of appetite.    Past Medical History:  Diagnosis Date  . Aortic stenosis   . Chronic diastolic CHF (congestive heart failure) ([place])   . Diabetes mellitus without complication ([place])   . Elevated BUN 08/2016  . Elevated creatine kinase   . High cholesterol   . Hypertension      Past Surgical History:  Procedure Laterality Date  . ABDOMINAL HYSTERECTOMY    . RIGHT/LEFT HEART CATH AND CORONARY ANGIOGRAPHY N/A 09/03/2016   Procedure: Right/Left Heart Cath and Coronary Angiography;  Surgeon: Burnell Blanks, MD;  Location: [facility];  Service: Cardiovascular;  Laterality: N/A;     Social History   Socioeconomic History  . Marital status: Single    Spouse name: Not on file  . Number of children: 2  . Years of education: Not on file  . Highest education level: Not on file  Occupational History  . Occupation: Special educational needs teacher, school cafeteria-retired  Social Needs  . Financial resource strain: Not on file  . Food insecurity    Worry: Not on file    Inability: Not on file  . Transportation needs    Medical: Not on file    Non-medical: Not on file  Tobacco Use  . Smoking status: Never Smoker  . Smokeless tobacco: Never Used  Substance and Sexual Activity  . Alcohol use: No  . Drug use: No  . Sexual activity: Not on file  Lifestyle   . Physical activity    Days per week: Not on file    Minutes per session: Not on file  . Stress: Not on file  Relationships  . Social Herbalist on phone: Not on file    Gets together: Not on file    Attends religious service: Not on file    Active member of club or organization: Not on file    Attends meetings of clubs or organizations: Not on file    Relationship status: Not on file  . Intimate partner violence    Fear of current or ex partner: Not on file    Emotionally abused: Not on file    Physically abused: Not on file    Forced sexual activity: Not on file  Other Topics Concern  . Not on file  Social History Narrative  . Not on file     Family History  Problem Relation Age of Onset  . Diabetes Mother   . Cancer Father     Current Outpatient Medications on File Prior to Visit  Medication Sig Dispense Refill  . albuterol (PROVENTIL HFA;VENTOLIN HFA) 108 (90 Base) MCG/ACT inhaler Inhale 3 puffs into the lungs every 6 (six) hours as needed for wheezing or shortness of breath.    Marland Kitchen amLODipine (NORVASC) 10 MG tablet TAKE 1 TABLET BY MOUTH ONCE DAILY 90  tablet 3  . aspirin EC 81 MG tablet Take 81 mg by mouth daily.    Marland Kitchen BIOTIN 5000 PO Take 5,000 mcg by mouth daily.    . cholecalciferol (VITAMIN D) 400 units TABS tablet Take 400 Units by mouth daily.    Marland Kitchen doxycycline (VIBRAMYCIN) 100 MG capsule Take 1 capsule (100 mg total) by mouth 2 (two) times daily. One po bid x 7 days 14 capsule 0  . furosemide (LASIX) 80 MG tablet Take 63m (1 tablet) in the morning, and 43m(1/2 tablet in the evening) 60 tablet 1  . hydrALAZINE (APRESOLINE) 25 MG tablet TAKE 1 TABLET BY MOUTH EVERY 8 HOURS 90 tablet 0  . meclizine (ANTIVERT) 25 MG tablet Take 0.5-1 tablets (12.5-25 mg total) by mouth 3 (three) times daily as needed for dizziness. 15 tablet 0  . metroNIDAZOLE (FLAGYL) 500 MG tablet Take 1 tablet (500 mg total) by mouth 2 (two) times daily. One po bid x 7 days 14 tablet 0  .  nebivolol (BYSTOLIC) 5 MG tablet Take 2.5 mg by mouth daily.     . rosuvastatin (CRESTOR) 20 MG tablet Take 20 mg by mouth daily.    . vitamin C (ASCORBIC ACID) 500 MG tablet Take 500 mg by mouth daily.     No current facility-administered medications on file prior to visit.     Cardiovascular studies:  EKG 08/07/2018: Sinus rhythm 53 bpm. IVCD.  Hospital echocardiogram 08/2016 - Left ventricle: The cavity size was normal. Wall thickness was   increased in a pattern of moderate LVH. There was focal basal   hypertrophy. Systolic function was normal. The estimated ejection   fraction was in the range of 60% to 65%. Features are consistent   with a pseudonormal left ventricular filling pattern, with   concomitant abnormal relaxation and increased filling pressure   (grade 2 diastolic dysfunction). - Aortic valve: There was severe stenosis. There was moderate   regurgitation. - Right ventricle: The cavity size was mildly dilated. - Tricuspid valve: There was moderate regurgitation. - Pulmonary arteries: Systolic pressure was mildly increased. PA   peak pressure: 38 mm Hg (S). - Pericardium, extracardiac: There was a left pleural effusion.   Impressions:   - The aortic valve is severely calcified.   There is severe aortic stenosis and moderate aortic insufficiency   Normal LV function  Recent labs: 02/18/2017:. Glucose 83. BUNs/creatinine 49/2.39. EGFR 20. Sodium 142, potassium 4.6.Labs 09/14/2016: BUN 59/Creat 2.6, sodium 138, potassium 4.5, eGFR 17   Review of Systems  Constitution: Positive for malaise/fatigue. Negative for decreased appetite, weight gain and weight loss.  HENT: Negative for congestion.   Eyes: Negative for visual disturbance.  Cardiovascular: Positive for dyspnea on exertion and leg swelling. Negative for chest pain, palpitations and syncope.  Respiratory: Positive for shortness of breath. Negative for cough.   Endocrine: Negative for cold intolerance.   Hematologic/Lymphatic: Does not bruise/bleed easily.  Skin: Negative for itching and rash.  Musculoskeletal: Negative for myalgias.  Gastrointestinal: Negative for abdominal pain, nausea and vomiting.       Loss of appetite  Genitourinary: Negative for dysuria.  Neurological: Negative for dizziness and weakness.  Psychiatric/Behavioral: The patient is not nervous/anxious.   All other systems reviewed and are negative.       Vitals:   08/08/18 1159  BP: (!) 152/59  Pulse: (!) 58  Temp: 97.7 F (36.5 C)  SpO2: (!) 83%   (Measured by the patient using a home BP monitor)  Body mass index is 26.31 kg/m. Filed Weights   08/08/18 1159  Weight: 168 lb (76.2 kg)     Objective:    Physical Exam  Constitutional: She is oriented to person, place, and time. She appears well-developed and well-nourished. No distress.  HENT:  Head: Normocephalic and atraumatic.  Eyes: Pupils are equal, round, and reactive to light. Conjunctivae are normal.  Neck: No JVD present.  Cardiovascular: Normal rate and regular rhythm.  Murmur heard.  Harsh midsystolic murmur is present with a grade of 4/6 at the upper right sternal border radiating to the neck. Pulmonary/Chest: Effort normal and breath sounds normal. She has no wheezes. She has no rales.  Abdominal: Soft. Bowel sounds are normal. There is no rebound.  Musculoskeletal:        General: No edema.  Lymphadenopathy:    She has no cervical adenopathy.  Neurological: She is alert and oriented to person, place, and time. No cranial nerve deficit.  Skin: Skin is warm and dry.  Psychiatric: She has a normal mood and affect.  Nursing note and vitals reviewed.       Assessment & Recommendations:   85 year old African-American female with hypertension, type II diabetes mellitus, CKD stage IV, critical aortic stenosis   Critical aortic stenosis: Unfortunately not a candidate for TAVR due to renal insufficiency. Patient has had progressive  decline in her health and symptoms. Natural history of severe aortic stenosis is evident. I had a long conversation with the patient and her family regarding realistic expectations about her prognosis. Patient has already made her wishes clear regarding DNR status. I have referred to home hospice care to better treat her symptoms at home, in familiar surroundings. Patient would not like hospitalization. For symptomatic relief, I have increased her lasix to 40 mg bid. I will let hospice service take over the further care.    Nigel Mormon, MD [facility] Pager: [phone] Office: [phone] If no answer Cell [phone]

## 2018-08-14 ENCOUNTER — Telehealth: Payer: Self-pay | Admitting: Cardiology

## 2018-10-14 DEATH — deceased

## 2019-06-10 NOTE — Telephone Encounter (Signed)
Complete
